# Patient Record
Sex: Female | Born: 1975 | Hispanic: Yes | Marital: Married | Smoking: Current some day smoker
Health system: Southern US, Community
[De-identification: ages and names within clinical notes are randomized; demographics above are authoritative.]

## PROBLEM LIST (undated history)

## (undated) DIAGNOSIS — D496 Neoplasm of unspecified behavior of brain: Secondary | ICD-10-CM

## (undated) DIAGNOSIS — A6000 Herpesviral infection of urogenital system, unspecified: Secondary | ICD-10-CM

## (undated) DIAGNOSIS — M542 Cervicalgia: Secondary | ICD-10-CM

## (undated) DIAGNOSIS — J45909 Unspecified asthma, uncomplicated: Secondary | ICD-10-CM

## (undated) HISTORY — PX: ABDOMINAL HYSTERECTOMY: SHX81

## (undated) HISTORY — DX: Unspecified asthma, uncomplicated: J45.909

## (undated) HISTORY — DX: Herpesviral infection of urogenital system, unspecified: A60.00

## (undated) HISTORY — DX: Neoplasm of unspecified behavior of brain: D49.6

## (undated) HISTORY — DX: Cervicalgia: M54.2

---

## 2009-10-19 ENCOUNTER — Ambulatory Visit: Payer: Self-pay | Admitting: Family Medicine

## 2009-10-20 ENCOUNTER — Ambulatory Visit: Payer: Self-pay | Admitting: Internal Medicine

## 2009-10-23 ENCOUNTER — Ambulatory Visit: Payer: Self-pay | Admitting: Internal Medicine

## 2010-08-11 ENCOUNTER — Ambulatory Visit: Payer: Self-pay | Admitting: Internal Medicine

## 2012-03-07 ENCOUNTER — Ambulatory Visit: Payer: Self-pay | Admitting: Internal Medicine

## 2012-11-20 ENCOUNTER — Ambulatory Visit: Payer: Self-pay | Admitting: Internal Medicine

## 2012-11-20 LAB — CBC WITH DIFFERENTIAL/PLATELET
Eosinophil %: 1.1 %
HCT: 36.9 % (ref 35.0–47.0)
HGB: 12.1 g/dL (ref 12.0–16.0)
Lymphocyte %: 16.2 %
MCHC: 32.8 g/dL (ref 32.0–36.0)
MCV: 87 fL (ref 80–100)
Monocyte #: 1.1 x10 3/mm — ABNORMAL HIGH (ref 0.2–0.9)
Monocyte %: 7.8 %
Neutrophil #: 10.7 10*3/uL — ABNORMAL HIGH (ref 1.4–6.5)
Neutrophil %: 74.5 %
RBC: 4.23 10*6/uL (ref 3.80–5.20)
RDW: 13.1 % (ref 11.5–14.5)
WBC: 14.3 10*3/uL — ABNORMAL HIGH (ref 3.6–11.0)

## 2012-11-20 LAB — COMPREHENSIVE METABOLIC PANEL
Alkaline Phosphatase: 83 U/L (ref 50–136)
Anion Gap: 9 (ref 7–16)
BUN: 6 mg/dL — ABNORMAL LOW (ref 7–18)
Bilirubin,Total: 0.3 mg/dL (ref 0.2–1.0)
Calcium, Total: 9.1 mg/dL (ref 8.5–10.1)
Chloride: 101 mmol/L (ref 98–107)
Co2: 26 mmol/L (ref 21–32)
Creatinine: 0.55 mg/dL — ABNORMAL LOW (ref 0.60–1.30)
EGFR (African American): >60
Osmolality: 268 (ref 275–301)
Potassium: 3.9 mmol/L (ref 3.5–5.1)
SGOT(AST): 12 U/L — ABNORMAL LOW (ref 15–37)
Sodium: 136 mmol/L (ref 136–145)
Total Protein: 7.3 g/dL (ref 6.4–8.2)

## 2012-11-20 LAB — URINALYSIS, COMPLETE
Glucose,UR: NEGATIVE mg/dL (ref 0–75)
Ketone: NEGATIVE
Ph: 6 (ref 4.5–8.0)
Protein: NEGATIVE
Specific Gravity: 1.01 (ref 1.003–1.030)

## 2012-11-20 LAB — AMYLASE: Amylase: 86 U/L (ref 25–115)

## 2012-11-20 LAB — LIPASE, BLOOD: Lipase: 131 U/L (ref 73–393)

## 2013-02-17 ENCOUNTER — Ambulatory Visit: Payer: Self-pay | Admitting: Obstetrics and Gynecology

## 2013-02-21 ENCOUNTER — Observation Stay: Payer: Self-pay | Admitting: Obstetrics and Gynecology

## 2013-02-21 LAB — URINALYSIS, COMPLETE
Bacteria: NONE SEEN
Glucose,UR: 150 mg/dL (ref 0–75)
Ketone: NEGATIVE
Protein: NEGATIVE
RBC,UR: 1 /HPF (ref 0–5)
Specific Gravity: 1.012 (ref 1.003–1.030)
Squamous Epithelial: 2
WBC UR: 2 /HPF (ref 0–5)

## 2013-03-05 ENCOUNTER — Ambulatory Visit: Payer: Self-pay | Admitting: Obstetrics and Gynecology

## 2013-03-05 LAB — CBC WITH DIFFERENTIAL/PLATELET
Basophil %: 0.1 %
Eosinophil #: 0 10*3/uL (ref 0.0–0.7)
Eosinophil %: 0.4 %
Lymphocyte #: 2.5 10*3/uL (ref 1.0–3.6)
Lymphocyte %: 18.8 %
MCHC: 34.7 g/dL (ref 32.0–36.0)
MCV: 87 fL (ref 80–100)
Monocyte #: 1.1 x10 3/mm — ABNORMAL HIGH (ref 0.2–0.9)
Monocyte %: 8 %
Neutrophil %: 72.7 %
RBC: 3.62 10*6/uL — ABNORMAL LOW (ref 3.80–5.20)
RDW: 14 % (ref 11.5–14.5)
WBC: 13.5 10*3/uL — ABNORMAL HIGH (ref 3.6–11.0)

## 2013-03-09 ENCOUNTER — Inpatient Hospital Stay: Payer: Self-pay | Admitting: Obstetrics and Gynecology

## 2013-03-10 LAB — HEMATOCRIT: HCT: 28.7 % — ABNORMAL LOW (ref 35.0–47.0)

## 2013-03-11 LAB — PATHOLOGY REPORT

## 2013-04-06 HISTORY — PX: CRANIOTOMY: SHX93

## 2014-08-29 ENCOUNTER — Emergency Department: Payer: Self-pay | Admitting: Emergency Medicine

## 2014-09-01 LAB — BETA STREP CULTURE(ARMC)

## 2014-11-26 NOTE — Consult Note (Signed)
   Maternal Age 39   Gravida 2   Para 1   Term Deliveries 1   Preterm Deliveries 0   Abortions 0   Living Children 1   Blood Type (Maternal) O positive   VDRL/RPR/Syphilis Results (Maternal) negative   Rubella Results (Maternal) immune   Hepatitis B Surface Antigen Results (Maternal) negative   Prenatal Care Adequate    Additional Comments Asked by Dr. Enzo Bi to provide prenatal consultation for this 19 y.o  G2 P1 mother who will have C/section on 8/4 at [redacted] wks EGA because she has a brain tumor for which she will undergo craniotomy 2 weeks later.  Pregnancy also complicated by diet-controlled gestational DM and recent ultrasound showed IUGR (infant measuring at 31 wk size, EFW 1560 gms).  Otherwise US shows normal fetal anatomy, fluid, and activity.  Mother has been given a course of BMZ.  Talked with patient and paternal GM about usual procedure at delivery including possible need for resuscitation.  Then presented expectations for preterm infants at 33 wks, including possible respiratory distress requiring O2 or respiratory support, hypothermia requiring incubator, feeding problems, and need for IV support.  Presented uncertain LOS, possibly 3 - 4 wks or until 36 - [redacted] wks EGA.  Discussed desirability of feeding mother's milk - she plans to pump and breast feed ASAP after delivery.  Patient and PGM were attentive and asked appropriate questions and expressed appreciation for my input.  Thank you for consulting Neonatology   Face-to-face time 20 minutes   Electronic Signatures: Bettey Costa (MD)  (Signed 31-Jul-14 17:35)  Authored: PREGNANCY and LABOR, ADDITIONAL COMMENTS   Last Updated: 31-Jul-14 17:35 by Bettey Costa (MD)

## 2014-11-26 NOTE — Op Note (Signed)
PATIENT NAME:  Lynn Chavez, Lynn Chavez MR#:  [id number] DATE OF BIRTH:  [birth year]  DATE OF PROCEDURE:  03/09/2013  PREOPERATIVE DIAGNOSES: 1.  32.0 week pregnancy, undelivered.  2.  Gestational diabetes, diet controlled.  3.  Sulcus brain tumor, maternal.   POSTOPERATIVE DIAGNOSES: 1.  32.0 week pregnancy, delivered.  2.  Gestational diabetes mellitus, diet controlled.  3.  Sulcus brain tumor, maternal.  4.  Viable female infant 3 pounds, 9 ounces.  5.  Desires sterilization.   OPERATIVE PROCEDURES: Primary low cervical transverse cesarean section with bilateral partial salpingectomy.   SURGEON: Lynn Chavez, M.D.  FIRST ASSISTANT: Lynn Chavez, certified nurse midwife.   ANESTHESIA: Spinal.   INDICATIONS: The patient is a 39 year old Hispanic female gravida 3, para 2-0-0-2, at 32.[redacted] weeks gestation, who presents for primary low cervical transverse cesarean section and bilateral partial salpingectomy for worsening symptomatic brain tumor in mom. The patient has developed vision loss; work-up has been notable for a sulcus brain tumor that is to require surgery within the next two weeks. The patient has received steroids for lung maturation prior to delivery.   FINDINGS AT SURGERY: Revealed a viable female infant 3 pounds, 9 ounces. Apgars were pending. The uterus, tubes, and ovaries were grossly normal.   DESCRIPTION OF PROCEDURE: The patient was brought to the operating room where she was placed in position. Spinal anesthetic was introduced without difficulty. There was placed in the supine position with right lateral hip roll in place. A ChloraPrep prep and drape was performed in the standard fashion. Foley catheter had been placed previously and was draining clear yellow urine. After checking for adequate level of anesthesia, a Pfannenstiel incision was made to the abdomen. The fascia was incised transversely and extended bilaterally. The midline raphe was incised, separated and the  peritoneum was entered. Bladder flap was created through sharp dissection. Low transverse incision was made in the uterus and this was extended bluntly bilaterally. The fetus was delivered through a vertex presentation and was noted to be vigorous at birth. Delayed Umbilical Cord clamping was done. The cord was triply clamped and cut. The infant was handed off to the awaiting resuscitation team. Cord blood sampling was obtained. Placenta was expressed from the uterus. The uterus was externalized onto the anterior abdominal wall and was cleared of all debris with laps. The incision was closed in two layers using #1 chromic suture. First layer was a running locking stitch, second layer was an imbricating layer. An additional figure-of-eight stitch was placed for hemostasis in the lower uterine segment. After noting normal adnexa bilaterally. The tubal ligation was performed in routine manner. The fallopian tubes were grasped with a Babcock clamp. A 0 plain suture was used to tie off midsegment portion of the tube. A second suture ligature was placed. The tube segment was excised. This was done bilaterally. Good hemostasis was noted. The tubes were sent to pathology. On completion of the procedure, the uterus was placed back into the abdominal pelvic cavity. Gutters were cleared of all debris with laps. The incision was closed in layers using 0 Maxon in a simple running manner on the fascia. The skin was closed with staples. Pressure dressing was applied. The patient was then mobilized and taken to the recovery room in satisfactory condition.   ESTIMATED BLOOD LOSS: 500 Ml.   COMPLICATIONS:  None.    All instruments, needle, and sponge counts were verified as correct.   The patient did receive Cleocin 900 mg IV antibiotic prophylaxis.  ____________________________ Lynn Slim Matty Deamer, MD mad:cc D: 03/09/2013 13:33:16 ET T: 03/09/2013 14:48:05 ET JOB#: [id number]  cc: Lynn Done A. Xana Bradt, MD,  <Dictator> [facility] MD ELECTRONICALLY SIGNED 03/10/2013 7:05

## 2014-12-14 NOTE — H&P (Signed)
L&D Evaluation:  History:  HPI 39yo MHF presents for scheduled primary c/s at 32 weeks secondary to succal brain tumor causing vision loss with planned surgery for removal in 2 weeks.   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 and partial loss of vision in rt eye   Patient's Medical History No Chronic Illness   Medications Pre Natal Vitamins  Tylenol (Acetaminophen)   Allergies NKDA   Social History none   Family History Non-Contributory   ROS:  General normal   HEENT HA, complete loss of vision of left eye   CNS normal   GI normal   GU normal   Resp normal   CV normal   Renal normal   MS normal   Exam:  Vital Signs stable   General no apparent distress   Mental Status clear   Chest clear   Heart normal sinus rhythm   Abdomen gravid, non-tender   Estimated Fetal Weight Average for gestational age   Pelvic cervix closed and thick   FHT normal rate with no decels   Ucx absent   Skin dry   Impression:  Impression 32 weeks for elective primary ceserean with bilateral tubal ligation   Plan:  Plan c/s with BTL   Electronic Signatures for Addendum Section:  DeFrancesco, Alanda Slim (MD) (Signed Addendum 04-Aug-14 12:38)  Pre op Counseling:  Pt is to have Primary Low Transverse Cesarean Section and BPS for symptomatic Sulcal Brain tumor causing vision loss. Pt has been counseled regarding indications for surgery and she is accepting of all risks, including but not limited to bleeding/infection/pelvic organ injury with need for repair/fetal injury/blood clot disorders/anesthesiua risks/BTL failure of 1 out of 300. All questions are answered. Informed consent is given.   Electronic Signatures: Evonnie Pat (CNM)  (Signed 04-Aug-14 11:54)  Authored: L&D Evaluation   Last Updated: 04-Aug-14 12:38 by DeFrancesco, Alanda Slim (MD)

## 2015-01-12 ENCOUNTER — Telehealth: Payer: Self-pay | Admitting: Obstetrics and Gynecology

## 2015-01-12 NOTE — Telephone Encounter (Signed)
PT AE IS MOVED TO 7/28 WILL YOU REFILL HER ACYLCOVIR UNTIL THAT DATE

## 2015-01-17 MED ORDER — ACYCLOVIR 400 MG PO TABS
400.0000 mg | ORAL_TABLET | Freq: Two times a day (BID) | ORAL | Status: DC
Start: 2015-01-17 — End: 2020-05-10

## 2015-01-17 MED ORDER — ACYCLOVIR 400 MG PO TABS: 400.0000 mg | ORAL_TABLET | Freq: Two times a day (BID) | ORAL | Status: DC

## 2015-01-17 NOTE — Telephone Encounter (Signed)
Refilled patients medication pt was notified

## 2015-03-01 ENCOUNTER — Encounter: Payer: Self-pay | Admitting: *Deleted

## 2015-03-03 ENCOUNTER — Encounter: Payer: Self-pay | Admitting: Obstetrics and Gynecology

## 2015-11-23 ENCOUNTER — Encounter: Payer: Self-pay | Admitting: *Deleted

## 2015-11-23 ENCOUNTER — Ambulatory Visit
Admission: EM | Admit: 2015-11-23 | Discharge: 2015-11-23 | Disposition: A | Discharge status: Home / Self Care | Payer: Self-pay | Attending: Family Medicine | Admitting: Family Medicine

## 2015-11-23 DIAGNOSIS — B349 Viral infection, unspecified: Secondary | ICD-10-CM

## 2015-11-23 LAB — RAPID INFLUENZA A&B ANTIGENS (ARMC ONLY)
INFLUENZA A (ARMC): NEGATIVE
INFLUENZA B (ARMC): NEGATIVE

## 2015-11-23 MED ORDER — HYDROCOD POLST-CPM POLST ER 10-8 MG/5ML PO SUER: 5.0000 mL | Freq: Every evening | ORAL | Status: DC | PRN

## 2015-11-23 MED ORDER — OSELTAMIVIR PHOSPHATE 75 MG PO CAPS: 75.0000 mg | ORAL_CAPSULE | Freq: Two times a day (BID) | ORAL | Status: DC

## 2015-11-23 NOTE — ED Notes (Signed)
Non-productive cough, fever, chest/abd soreness/pain with coughing, headache, since Sunday.

## 2015-11-23 NOTE — ED Provider Notes (Signed)
CSN: [id number]     Arrival date & time 11/23/15  1344 History   First MD Initiated Contact with Patient 11/23/15 1529     Chief Complaint  Patient presents with  . Cough  . Pleurisy  . Headache   (Consider location/radiation/quality/duration/timing/severity/associated sxs/prior Treatment) HPI: Patient presents today with nonproductive cough, subjective fever, chest soreness with coughing, headache, myalgias since Sunday. She denies any shortness of breath. She admits to recently quitting smoking. She denies any history of COPD. She has been told that she has had bronchitis in the past. She has had some nasal drainage and has been using allergy medication for this.  Past Medical History  Diagnosis Date  . Asthma   . Cervicalgia   . Brain tumor ([place])   . Genital herpes    Past Surgical History  Procedure Laterality Date  . Craniotomy  04/2013   Family History  Problem Relation Age of Onset  . Diabetes Father   . Diabetes Paternal Grandmother    Social History  Substance Use Topics  . Smoking status: Former Research scientist (life sciences)  . Smokeless tobacco: None  . Alcohol Use: Yes   OB History    Gravida Para Term Preterm AB TAB SAB Ectopic Multiple Living   1         1     Review of Systems: Negative except mentioned above.  Allergies  Penicillins  Home Medications   Prior to Admission medications   Medication Sig Start Date End Date Taking? Authorizing Provider  acyclovir (ZOVIRAX) 400 MG tablet Take 1 tablet (400 mg total) by mouth 2 (two) times daily. 01/17/15   Melody Rockney Ghee, CNM   Meds Ordered and Administered this Visit  Medications - No data to display  BP 118/63 mmHg  Pulse 79  Temp(Src) 99.2 F (37.3 C) (Oral)  Resp 16  Ht 5\' 3"  (1.6 m)  Wt 172 lb (78.019 kg)  BMI 30.48 kg/m2  SpO2 98%  LMP 11/04/2015 (Exact Date) No data found.   Physical Exam   GENERAL: NAD HEENT: mild pharyngeal erythema, no exudate, no erythema of TMs, no cervical LAD RESP: CTA B CARD:  RRR   ED Course  Procedures (including critical care time)  Labs Review Labs Reviewed  RAPID INFLUENZA A&B ANTIGENS ([facility] ONLY)    Imaging Review No results found.     MDM   A/P: Viral respiratory infection-will treat patient with Tamiflu, Tylenol/Motrin when necessary, oral antihistamine or nasal steroid prn, Tussionex when necessary at bedtime, she will try using Delsym during the daytime, rest, hydration, seek medical attention if symptoms persist or worsen as discussed.   Paulina Fusi, MD 11/23/15 [phone]

## 2017-08-17 ENCOUNTER — Encounter: Payer: Self-pay | Admitting: Emergency Medicine

## 2017-08-17 ENCOUNTER — Ambulatory Visit
Admission: EM | Admit: 2017-08-17 | Discharge: 2017-08-17 | Disposition: A | Discharge status: Home / Self Care | Payer: Self-pay | Attending: Family Medicine | Admitting: Family Medicine

## 2017-08-17 ENCOUNTER — Other Ambulatory Visit: Payer: Self-pay

## 2017-08-17 DIAGNOSIS — R05 Cough: Secondary | ICD-10-CM

## 2017-08-17 DIAGNOSIS — J069 Acute upper respiratory infection, unspecified: Secondary | ICD-10-CM

## 2017-08-17 DIAGNOSIS — B349 Viral infection, unspecified: Secondary | ICD-10-CM

## 2017-08-17 MED ORDER — IBUPROFEN 800 MG PO TABS: 800.0000 mg | ORAL_TABLET | Freq: Three times a day (TID) | ORAL | 0 refills | Status: DC

## 2017-08-17 MED ORDER — PROMETHAZINE-DM 6.25-15 MG/5ML PO SYRP: 5.0000 mL | ORAL_SOLUTION | Freq: Four times a day (QID) | ORAL | 0 refills | Status: DC | PRN

## 2017-08-17 NOTE — Discharge Instructions (Signed)
Please make sure you are drinking lots of fluids.  Take medication as prescribed for cough and nausea.  Continue with Tylenol Cold and flu.  You may use ibuprofen as needed during the day for body aches.  Return to the urgent care facility for any worsening symptoms or urgent changes in health.

## 2017-08-17 NOTE — ED Triage Notes (Signed)
Patient c/o cough, congestion, bodyaches and fever since Thursday.

## 2017-08-17 NOTE — ED Provider Notes (Signed)
[facility]    CSN: [id number] Arrival date & time: 08/17/17  1513     History   Chief Complaint Chief Complaint  Patient presents with  . Cough  . Generalized Body Aches  . Fever    HPI Lynn Chavez is a 42 y.o. female presents to the urgent care facility for evaluation of cough, vomiting x2, body aches and fever.  Symptoms been present for 3 days.  She has been taking Tylenol cold and flu as well as Delsym.  Fevers have been subjective.  She denies any chest pain, shortness of breath.  Cough is nonproductive.  She denies any sore throat or difficulty swallowing.  HPI  Past Medical History:  Diagnosis Date  . Asthma   . Brain tumor ([place])   . Cervicalgia   . Genital herpes     There are no active problems to display for this patient.   Past Surgical History:  Procedure Laterality Date  . CRANIOTOMY  04/2013    OB History    Gravida Para Term Preterm AB Living   1         1   SAB TAB Ectopic Multiple Live Births           1       Home Medications    Prior to Admission medications   Medication Sig Start Date End Date Taking? Authorizing Provider  acyclovir (ZOVIRAX) 400 MG tablet Take 1 tablet (400 mg total) by mouth 2 (two) times daily. 01/17/15   Shambley, Melody N, CNM  chlorpheniramine-HYDROcodone (TUSSIONEX PENNKINETIC ER) 10-8 MG/5ML SUER Take 5 mLs by mouth at bedtime as needed for cough. Can cause drowsiness. 11/23/15   Paulina Fusi, MD  ibuprofen (ADVIL,MOTRIN) 800 MG tablet Take 1 tablet (800 mg total) by mouth 3 (three) times daily. 08/17/17   Duanne Guess, PA-C  oseltamivir (TAMIFLU) 75 MG capsule Take 1 capsule (75 mg total) by mouth every 12 (twelve) hours. 11/23/15   Paulina Fusi, MD  promethazine-dextromethorphan (PROMETHAZINE-DM) 6.25-15 MG/5ML syrup Take 5 mLs by mouth 4 (four) times daily as needed for cough. 08/17/17   Duanne Guess, PA-C    Family History Family History  Problem Relation Age of Onset  .  Diabetes Father   . Diabetes Paternal Grandmother     Social History Social History   Tobacco Use  . Smoking status: Former Research scientist (life sciences)  . Smokeless tobacco: Never Used  Substance Use Topics  . Alcohol use: Yes  . Drug use: No     Allergies   Penicillins   Review of Systems Review of Systems  Constitutional: Positive for chills and fever. Negative for activity change and fatigue.  HENT: Positive for congestion. Negative for sinus pressure and sore throat.   Eyes: Negative for visual disturbance.  Respiratory: Positive for cough. Negative for chest tightness and shortness of breath.   Cardiovascular: Negative for chest pain and leg swelling.  Gastrointestinal: Positive for vomiting. Negative for abdominal pain, diarrhea and nausea.  Genitourinary: Negative for dysuria and pelvic pain.  Musculoskeletal: Negative for arthralgias and gait problem.  Skin: Negative for rash.  Neurological: Negative for weakness, numbness and headaches.  Hematological: Negative for adenopathy.  Psychiatric/Behavioral: Negative for agitation, behavioral problems and confusion.     Physical Exam Triage Vital Signs ED Triage Vitals  Enc Vitals Group     BP 08/17/17 1541 117/75     Pulse Rate 08/17/17 1541 65     Resp 08/17/17 1541 16  Temp 08/17/17 1541 98.5 F (36.9 C)     Temp Source 08/17/17 1541 Oral     SpO2 08/17/17 1541 100 %     Weight 08/17/17 1538 172 lb (78 kg)     Height 08/17/17 1538 5\' 3"  (1.6 m)     Head Circumference --      Peak Flow --      Pain Score 08/17/17 1538 7     Pain Loc --      Pain Edu? --      Excl. in [place]? --    No data found.  Updated Vital Signs BP 117/75 (BP Location: Left Arm)   Pulse 65   Temp 98.5 F (36.9 C) (Oral)   Resp 16   Ht 5\' 3"  (1.6 m)   Wt 172 lb (78 kg)   SpO2 100%   BMI 30.47 kg/m   Visual Acuity Right Eye Distance:   Left Eye Distance:   Bilateral Distance:    Right Eye Near:   Left Eye Near:    Bilateral Near:      Physical Exam  Constitutional: She is oriented to person, place, and time. She appears well-developed and well-nourished. No distress.  HENT:  Head: Normocephalic and atraumatic.  Right Ear: Hearing, tympanic membrane, external ear and ear canal normal.  Left Ear: Hearing, tympanic membrane, external ear and ear canal normal.  Nose: Rhinorrhea present.  Mouth/Throat: Mucous membranes are normal. No trismus in the jaw. No uvula swelling. Posterior oropharyngeal erythema present. No oropharyngeal exudate, posterior oropharyngeal edema or tonsillar abscesses. No tonsillar exudate.  Eyes: Conjunctivae are normal. Right eye exhibits no discharge. Left eye exhibits no discharge.  Neck: Normal range of motion.  Cardiovascular: Normal rate and regular rhythm.  Pulmonary/Chest: Effort normal and breath sounds normal. No stridor. No respiratory distress. She has no wheezes. She has no rales.  Abdominal: Soft. She exhibits no distension. There is no tenderness. There is no guarding.  Musculoskeletal: Normal range of motion. She exhibits no deformity.  Lymphadenopathy:    She has cervical adenopathy.  Neurological: She is alert and oriented to person, place, and time. She has normal reflexes.  Skin: Skin is warm and dry.  Psychiatric: She has a normal mood and affect. Her behavior is normal. Thought content normal.     UC Treatments / Results  Labs (all labs ordered are listed, but only abnormal results are displayed) Labs Reviewed - No data to display  EKG  EKG Interpretation None       Radiology No results found.  Procedures Procedures (including critical care time)  Medications Ordered in UC Medications - No data to display   Initial Impression / Assessment and Plan / UC Course  I have reviewed the triage vital signs and the nursing notes.  Pertinent labs & imaging results that were available during my care of the patient were reviewed by me and considered in my medical  decision making (see chart for details).     42 year old female with 3 days of cough.,  Chills, fever, body aches as well as his physician and was told nausea with 2 episodes of vomiting.  Patient tolerating p.o. well.  Will give prescription for dextromethorphan with Phenergan cough syrup.  She will continue with Tylenol cold.  She will increase fluids and she is educated on signs and symptoms return to the clinic for.  Final Clinical Impressions(s) / UC Diagnoses   Final diagnoses:  Viral upper respiratory tract infection  Viral syndrome  ED Discharge Orders        Ordered    promethazine-dextromethorphan (PROMETHAZINE-DM) 6.25-15 MG/5ML syrup  4 times daily PRN     08/17/17 1550    ibuprofen (ADVIL,MOTRIN) 800 MG tablet  3 times daily     08/17/17 1550         Duanne Guess, [place] 08/17/17 1605

## 2017-08-19 ENCOUNTER — Telehealth: Payer: Self-pay | Admitting: Emergency Medicine

## 2017-08-19 MED ORDER — ONDANSETRON 8 MG PO TBDP: 8.0000 mg | ORAL_TABLET | Freq: Three times a day (TID) | ORAL | 0 refills | Status: DC | PRN

## 2017-08-19 NOTE — Telephone Encounter (Signed)
Patient called in this morning stating that she was seen over the weekend and was given cough medication that has Phenergan in it, but she is still very nauseous. She is requesting something else for nausea. Please advise.

## 2017-08-19 NOTE — Telephone Encounter (Signed)
rx sent for zofran

## 2017-08-20 ENCOUNTER — Emergency Department: Payer: Self-pay

## 2017-08-20 ENCOUNTER — Emergency Department
Admission: EM | Admit: 2017-08-20 | Discharge: 2017-08-20 | Disposition: A | Discharge status: Home / Self Care | Payer: Self-pay | Attending: Emergency Medicine | Admitting: Emergency Medicine

## 2017-08-20 ENCOUNTER — Encounter: Payer: Self-pay | Admitting: Medical Oncology

## 2017-08-20 DIAGNOSIS — Z880 Allergy status to penicillin: Secondary | ICD-10-CM | POA: Insufficient documentation

## 2017-08-20 DIAGNOSIS — J45909 Unspecified asthma, uncomplicated: Secondary | ICD-10-CM | POA: Insufficient documentation

## 2017-08-20 DIAGNOSIS — Z87891 Personal history of nicotine dependence: Secondary | ICD-10-CM | POA: Insufficient documentation

## 2017-08-20 DIAGNOSIS — J189 Pneumonia, unspecified organism: Secondary | ICD-10-CM

## 2017-08-20 DIAGNOSIS — J181 Lobar pneumonia, unspecified organism: Secondary | ICD-10-CM | POA: Insufficient documentation

## 2017-08-20 LAB — INFLUENZA PANEL BY PCR (TYPE A & B)
INFLAPCR: NEGATIVE
INFLBPCR: NEGATIVE

## 2017-08-20 MED ORDER — ONDANSETRON HCL 4 MG PO TABS: 4.0000 mg | ORAL_TABLET | Freq: Every day | ORAL | 0 refills | Status: AC | PRN

## 2017-08-20 MED ORDER — LEVOFLOXACIN 750 MG PO TABS: 750.0000 mg | ORAL_TABLET | Freq: Every day | ORAL | 0 refills | Status: AC

## 2017-08-20 MED ORDER — IPRATROPIUM-ALBUTEROL 0.5-2.5 (3) MG/3ML IN SOLN
3.0000 mL | Freq: Once | RESPIRATORY_TRACT | Status: AC
  Administered 2017-08-20: 3 mL via RESPIRATORY_TRACT
  Filled 2017-08-20: qty 3

## 2017-08-20 MED ORDER — ALBUTEROL SULFATE (2.5 MG/3ML) 0.083% IN NEBU: 2.5000 mg | INHALATION_SOLUTION | Freq: Four times a day (QID) | RESPIRATORY_TRACT | 12 refills | Status: DC | PRN

## 2017-08-20 MED ORDER — ONDANSETRON 4 MG PO TBDP
4.0000 mg | ORAL_TABLET | Freq: Once | ORAL | Status: AC
  Administered 2017-08-20: 4 mg via ORAL
  Filled 2017-08-20: qty 1

## 2017-08-20 MED ORDER — ALBUTEROL SULFATE HFA 108 (90 BASE) MCG/ACT IN AERS: 2.0000 | INHALATION_SPRAY | Freq: Four times a day (QID) | RESPIRATORY_TRACT | 0 refills | Status: DC | PRN

## 2017-08-20 MED ORDER — LEVOFLOXACIN 750 MG PO TABS
750.0000 mg | ORAL_TABLET | Freq: Once | ORAL | Status: AC
  Administered 2017-08-20: 750 mg via ORAL
  Filled 2017-08-20: qty 1

## 2017-08-20 NOTE — ED Triage Notes (Signed)
Pt c/o cough, congestion, body aches and chills since Thursday.

## 2017-08-20 NOTE — ED Provider Notes (Signed)
[facility] Emergency Department Provider Note  ____________________________________________  Time seen: Approximately 11:52 AM  I have reviewed the triage vital signs and the nursing notes.   HISTORY  Chief Complaint Influenza    HPI Lynn Chavez is a 42 y.o. female that presents to the emergency department for evaluation of chills, nasal congestion, postnasal drip, productive cough with yellow sputum, vomiting for 5 days.  She is vomiting about once or twice per day.  She went to urgent care 3 days ago and was told that she has a virus but she is not getting any better.  She has been taking Tylenol Cold and flu without relief. No abdominal pain, diarrhea, constipation..    Past Medical History:  Diagnosis Date  . Asthma   . Brain tumor ([place])   . Cervicalgia   . Genital herpes     There are no active problems to display for this patient.   Past Surgical History:  Procedure Laterality Date  . CRANIOTOMY  04/2013    Prior to Admission medications   Medication Sig Start Date End Date Taking? Authorizing Provider  acyclovir (ZOVIRAX) 400 MG tablet Take 1 tablet (400 mg total) by mouth 2 (two) times daily. 01/17/15   Shambley, Melody N, CNM  albuterol (PROVENTIL HFA;VENTOLIN HFA) 108 (90 Base) MCG/ACT inhaler Inhale 2 puffs into the lungs every 6 (six) hours as needed for wheezing or shortness of breath. 08/20/17   Laban Emperor, PA-C  albuterol (PROVENTIL) (2.5 MG/3ML) 0.083% nebulizer solution Take 3 mLs (2.5 mg total) by nebulization every 6 (six) hours as needed for wheezing or shortness of breath. 08/20/17   Laban Emperor, PA-C  chlorpheniramine-HYDROcodone (TUSSIONEX PENNKINETIC ER) 10-8 MG/5ML SUER Take 5 mLs by mouth at bedtime as needed for cough. Can cause drowsiness. 11/23/15   Paulina Fusi, MD  ibuprofen (ADVIL,MOTRIN) 800 MG tablet Take 1 tablet (800 mg total) by mouth 3 (three) times daily. 08/17/17   Duanne Guess, PA-C   levofloxacin (LEVAQUIN) 750 MG tablet Take 1 tablet (750 mg total) by mouth daily for 7 days. 08/20/17 08/27/17  Laban Emperor, PA-C  ondansetron (ZOFRAN ODT) 8 MG disintegrating tablet Take 1 tablet (8 mg total) by mouth every 8 (eight) hours as needed. 08/19/17   Norval Gable, MD  ondansetron (ZOFRAN) 4 MG tablet Take 1 tablet (4 mg total) by mouth daily as needed for nausea or vomiting. 08/20/17 08/20/18  Laban Emperor, PA-C  oseltamivir (TAMIFLU) 75 MG capsule Take 1 capsule (75 mg total) by mouth every 12 (twelve) hours. 11/23/15   Paulina Fusi, MD  promethazine-dextromethorphan (PROMETHAZINE-DM) 6.25-15 MG/5ML syrup Take 5 mLs by mouth 4 (four) times daily as needed for cough. 08/17/17   Duanne Guess, PA-C    Allergies Penicillins  Family History  Problem Relation Age of Onset  . Diabetes Father   . Diabetes Paternal Grandmother     Social History Social History   Tobacco Use  . Smoking status: Former Research scientist (life sciences)  . Smokeless tobacco: Never Used  Substance Use Topics  . Alcohol use: Yes  . Drug use: No     Review of Systems  Constitutional: No fever/chills ENT: Positive for congestion and rhinorrhea. Cardiovascular: No chest pain. Respiratory: Positive for cough.  Gastrointestinal: No abdominal pain.  No diarrhea.  No constipation. Musculoskeletal: Negative for musculoskeletal pain. Skin: Negative for rash, abrasions, lacerations, ecchymosis.   ____________________________________________   PHYSICAL EXAM:  VITAL SIGNS: ED Triage Vitals [08/20/17 1116]  Enc Vitals Group  BP 113/70     Pulse Rate 83     Resp 18     Temp 98.1 F (36.7 C)     Temp Source Oral     SpO2 97 %     Weight 175 lb (79.4 kg)     Height 5\' 3"  (1.6 m)     Head Circumference      Peak Flow      Pain Score 8     Pain Loc      Pain Edu?      Excl. in [place]?      Constitutional: Alert and oriented. Well appearing and in no acute distress. Eyes: Conjunctivae are normal. PERRL.  EOMI. No discharge. Head: Atraumatic. ENT: No frontal and maxillary sinus tenderness.      Ears: Tympanic membranes pearly gray with good landmarks. No discharge.      Nose: Mild congestion/rhinnorhea.      Mouth/Throat: Mucous membranes are moist. Oropharynx non-erythematous. Tonsils not enlarged. No exudates. Uvula midline. Neck: No stridor.   Hematological/Lymphatic/Immunilogical: No cervical lymphadenopathy. Cardiovascular: Normal rate, regular rhythm.  Good peripheral circulation. Respiratory: Normal respiratory effort without tachypnea or retractions. Lungs CTAB. Good air entry to the bases with no decreased or absent breath sounds. Gastrointestinal: Bowel sounds 4 quadrants. Soft and nontender to palpation. No guarding or rigidity. No palpable masses. No distention. Musculoskeletal: Full range of motion to all extremities. No gross deformities appreciated. Neurologic:  Normal speech and language. No gross focal neurologic deficits are appreciated.  Skin:  Skin is warm, dry and intact. No rash noted. Psychiatric: Mood and affect are normal. Speech and behavior are normal. Patient exhibits appropriate insight and judgement.   ____________________________________________   LABS (all labs ordered are listed, but only abnormal results are displayed)  Labs Reviewed  INFLUENZA PANEL BY PCR (TYPE A & B)   ____________________________________________  EKG   ____________________________________________  RADIOLOGY Robinette Haines, personally viewed and evaluated these images (plain radiographs) as part of my medical decision making, as well as reviewing the written report by the radiologist.  Dg Chest 2 View  Result Date: 08/20/2017 CLINICAL DATA:  42 year old female with cough, congestion, body aches and chills EXAM: CHEST  2 VIEW COMPARISON:  Prior chest x-ray 08/29/2014 FINDINGS: Patchy right upper lobe airspace opacity in a peribronchovascular distribution concerning for  bronchopneumonia. The remainder of the lungs are clear. No pleural effusion or pneumothorax. The cardiac and mediastinal contours are normal. Osseous structures are intact and unremarkable. IMPRESSION: Right upper lobe pneumonia. Electronically Signed   By: Jacqulynn Cadet M.D.   On: 08/20/2017 13:05    ____________________________________________    PROCEDURES  Procedure(s) performed:    Procedures    Medications  ondansetron (ZOFRAN-ODT) disintegrating tablet 4 mg (4 mg Oral Given 08/20/17 1212)  ipratropium-albuterol (DUONEB) 0.5-2.5 (3) MG/3ML nebulizer solution 3 mL (3 mLs Nebulization Given 08/20/17 1212)  levofloxacin (LEVAQUIN) tablet 750 mg (750 mg Oral Given 08/20/17 1357)     ____________________________________________   INITIAL IMPRESSION / ASSESSMENT AND PLAN / ED COURSE  Pertinent labs & imaging results that were available during my care of the patient were reviewed by me and considered in my medical decision making (see chart for details).  Review of the [place] CSRS was performed in accordance of the [place] prior to dispensing any controlled drugs.    Patient's diagnosis is consistent with pneumonia. Vital signs and exam are reassuring. Chest xray consistent with right upper lobe pneumonia. Influenza negative. She does not want  any blood work completed. Patient appears well. She felt "great" after duoneb and zofran. She drank several glasses of water, juice and ate graham crackers in ED. She was given a dose of Levaquin in ED. She remembers having uncontrollable vomiting with penicillin and thinks she is also allergic to azithromycin. Reasources to quit smoking were provided. Patient feels comfortable going home. Patient will be discharged home with prescriptions for levoquin, zofran, albuerol inhaler and nebulizer. Patient is to follow up with PCP in 2 days or if symptoms are worsening. Patient is given ED precautions to return to the ED for any worsening or new  symptoms.     ____________________________________________  FINAL CLINICAL IMPRESSION(S) / ED DIAGNOSES  Final diagnoses:  Pneumonia of right upper lobe due to infectious organism [facility])      NEW MEDICATIONS STARTED DURING THIS VISIT:  ED Discharge Orders        Ordered    levofloxacin (LEVAQUIN) 750 MG tablet  Daily     08/20/17 1340    ondansetron (ZOFRAN) 4 MG tablet  Daily PRN     08/20/17 1340    albuterol (PROVENTIL HFA;VENTOLIN HFA) 108 (90 Base) MCG/ACT inhaler  Every 6 hours PRN     08/20/17 1340    albuterol (PROVENTIL) (2.5 MG/3ML) 0.083% nebulizer solution  Every 6 hours PRN     08/20/17 1340          This chart was dictated using voice recognition software/Dragon. Despite best efforts to proofread, errors can occur which can change the meaning. Any change was purely unintentional.    Laban Emperor, PA-C 08/20/17 1857    Clearnce Hasten Randall An, MD 08/21/17 [phone]

## 2017-08-20 NOTE — ED Notes (Signed)
See triage note  Presents with body aches cough and congestion fro couple of days   Afebrile on arrival

## 2018-03-13 ENCOUNTER — Telehealth: Payer: Self-pay | Admitting: Obstetrics and Gynecology

## 2018-03-13 NOTE — Telephone Encounter (Signed)
The patient called and stated that she is  Needing to speak with a nurse if possible today. The patient did not disclose any other information. Please advise.

## 2019-01-05 ENCOUNTER — Other Ambulatory Visit: Payer: Self-pay

## 2019-01-05 ENCOUNTER — Emergency Department
Admission: EM | Admit: 2019-01-05 | Discharge: 2019-01-05 | Disposition: A | Discharge status: Home / Self Care | Payer: Self-pay | Attending: Emergency Medicine | Admitting: Emergency Medicine

## 2019-01-05 DIAGNOSIS — Z87891 Personal history of nicotine dependence: Secondary | ICD-10-CM | POA: Insufficient documentation

## 2019-01-05 DIAGNOSIS — Z20822 Contact with and (suspected) exposure to covid-19: Secondary | ICD-10-CM

## 2019-01-05 DIAGNOSIS — Z0279 Encounter for issue of other medical certificate: Secondary | ICD-10-CM | POA: Insufficient documentation

## 2019-01-05 DIAGNOSIS — Z20828 Contact with and (suspected) exposure to other viral communicable diseases: Secondary | ICD-10-CM | POA: Insufficient documentation

## 2019-01-05 DIAGNOSIS — R0981 Nasal congestion: Secondary | ICD-10-CM | POA: Insufficient documentation

## 2019-01-05 DIAGNOSIS — J45909 Unspecified asthma, uncomplicated: Secondary | ICD-10-CM | POA: Insufficient documentation

## 2019-01-05 DIAGNOSIS — J029 Acute pharyngitis, unspecified: Secondary | ICD-10-CM | POA: Insufficient documentation

## 2019-01-05 NOTE — ED Notes (Signed)
Pt exposed to positive Covid case (brother-in-law) 1 week ago and then again Saturday night  Pt reports "scratchy throat", nasal congestion, denies cough Here to be tested for Covid because she needs a MD note for work

## 2019-01-05 NOTE — ED Triage Notes (Signed)
Pt states she was exposed to someone who tested positive for covid. Pt denies having any sx.

## 2019-01-05 NOTE — ED Provider Notes (Signed)
[facility] Emergency Department Provider Note       Time seen: ----------------------------------------- 1:11 PM on 01/05/2019 -----------------------------------------   I have reviewed the triage vital signs and the nursing notes.  HISTORY   Chief Complaint Covid testing   HPI Lynn Chavez is a 43 y.o. female with a history of asthma, brain tumor, genital herpes who presents to the ED for recent COVID-19 exposure.  Patient has had mild respiratory symptoms but otherwise denies any other complaints.  She presents for further evaluation.  Past Medical History:  Diagnosis Date  . Asthma   . Brain tumor ([place])   . Cervicalgia   . Genital herpes     There are no active problems to display for this patient.   Past Surgical History:  Procedure Laterality Date  . CRANIOTOMY  04/2013    Allergies Penicillins  Social History Social History   Tobacco Use  . Smoking status: Former Research scientist (life sciences)  . Smokeless tobacco: Never Used  Substance Use Topics  . Alcohol use: Yes  . Drug use: No    Review of Systems Constitutional: Negative for fever. Cardiovascular: Negative for chest pain. Respiratory: Negative for shortness of breath. Gastrointestinal: Negative for abdominal pain, vomiting and diarrhea. Musculoskeletal: Negative for back pain. Skin: Negative for rash. Neurological: Negative for headaches, focal weakness or numbness.  All systems negative/normal/unremarkable except as stated in the HPI  ____________________________________________   PHYSICAL EXAM:  VITAL SIGNS: ED Triage Vitals  Enc Vitals Group     BP 01/05/19 1155 117/72     Pulse Rate 01/05/19 1155 60     Resp 01/05/19 1155 16     Temp 01/05/19 1156 98.6 F (37 C)     Temp Source 01/05/19 1155 Oral     SpO2 01/05/19 1155 98 %     Weight 01/05/19 1155 152 lb (68.9 kg)     Height 01/05/19 1155 5\' 3"  (1.6 m)     Head Circumference --      Peak Flow --      Pain  Score 01/05/19 1155 0     Pain Loc --      Pain Edu? --      Excl. in [place]? --    Constitutional: Alert and oriented. Well appearing and in no distress. Eyes: Conjunctivae are normal. Normal extraocular movements. ENT      Head: Normocephalic and atraumatic.      Nose: No congestion/rhinnorhea.      Mouth/Throat: Mucous membranes are moist.      Neck: No stridor. Cardiovascular: Normal rate, regular rhythm. No murmurs, rubs, or gallops. Respiratory: Normal respiratory effort without tachypnea nor retractions. Breath sounds are clear and equal bilaterally. No wheezes/rales/rhonchi. Gastrointestinal: Soft and nontender. Normal bowel sounds Musculoskeletal: Nontender with normal range of motion in extremities. No lower extremity tenderness nor edema. Neurologic:  Normal speech and language. No gross focal neurologic deficits are appreciated.  Skin:  Skin is warm, dry and intact. No rash noted. Psychiatric: Mood and affect are normal. Speech and behavior are normal.  ____________________________________________  ED COURSE:  As part of my medical decision making, I reviewed the following data within the [place] History obtained from family if available, nursing notes, old chart and ekg, as well as notes from prior ED visits. Patient presented for recent COVID-19 exposure, we will assess with labs and imaging as indicated at this time.   Procedures  Nickisha Adeliz Tonkinson was evaluated in Emergency Department on 01/05/2019 for  the symptoms described in the history of present illness. She was evaluated in the context of the global COVID-19 pandemic, which necessitated consideration that the patient might be at risk for infection with the SARS-CoV-2 virus that causes COVID-19. Institutional protocols and algorithms that pertain to the evaluation of patients at risk for COVID-19 are in a state of rapid change based on information released by regulatory bodies including the CDC  and federal and state organizations. These policies and algorithms were followed during the patient's care in the ED.  ____________________________________________   LABS (pertinent positives/negatives)  Labs Reviewed  SARS CORONAVIRUS 2 (HOSPITAL ORDER, [place] [facility])  ____________________________________________   DIFFERENTIAL DIAGNOSIS   COVID-19, viral illness, medical screening exam  FINAL ASSESSMENT AND PLAN  COVID-19 exposure   Plan: The patient had presented for recent COVID-19 exposure. Patient's labs are pending, we sent a send out lab and should know the results in 48 hours.  She is cleared for outpatient follow-up.   Laurence Aly, MD    Note: This note was generated in part or whole with voice recognition software. Voice recognition is usually quite accurate but there are transcription errors that can and very often do occur. I apologize for any typographical errors that were not detected and corrected.     Earleen Newport, MD 01/05/19 [phone]

## 2019-01-16 IMAGING — CR DG CHEST 2V
1 series · 2 of 2 positions shown · non-contrast
Comparison: Prior chest x-ray 08/29/2014

CLINICAL DATA: 41-year-old female with cough, congestion, body
aches and chills

EXAM:
CHEST  2 VIEW

[Series 1: dg chest 2 view · 0.14mm/px · 2 of 2 slices shown]
[im 1/2]
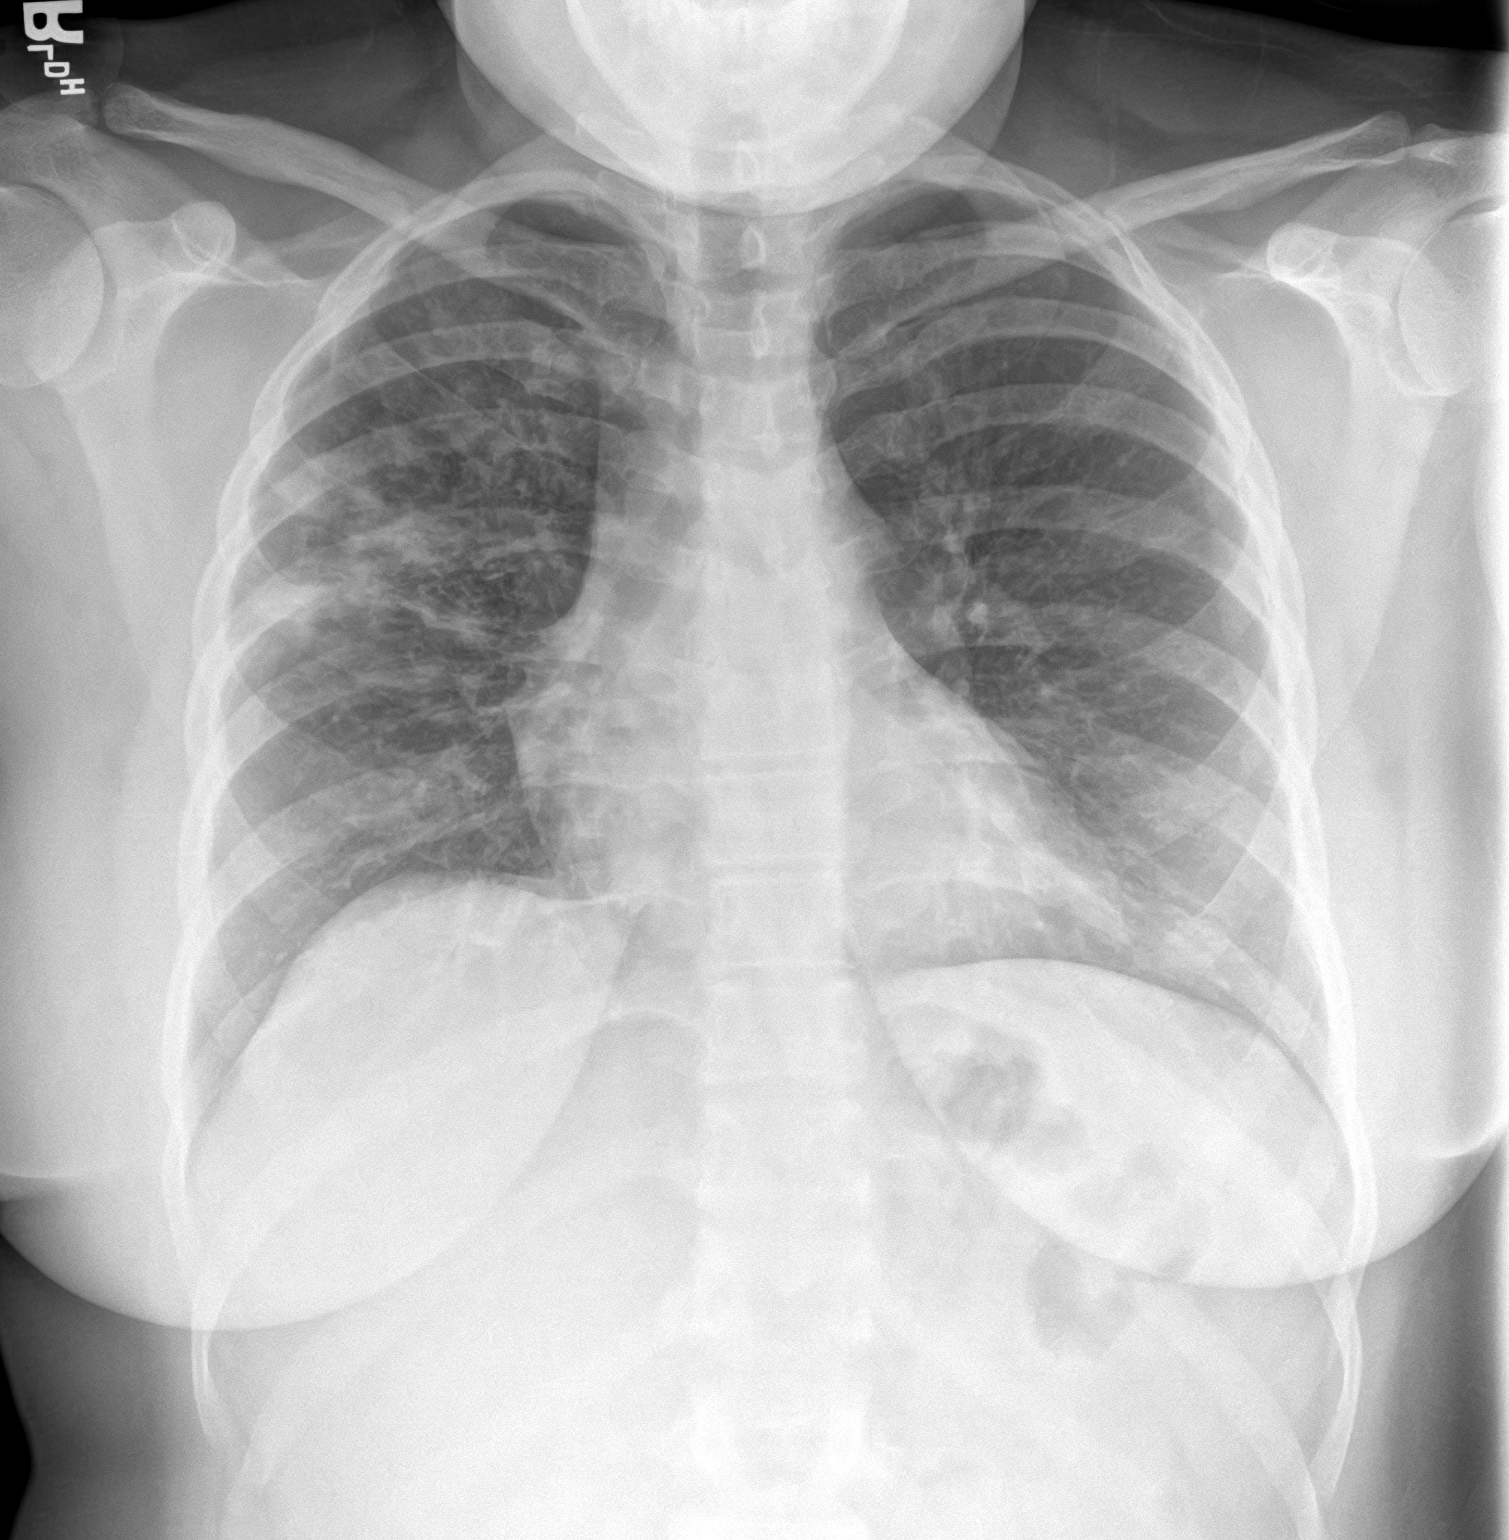
[im 2/2]
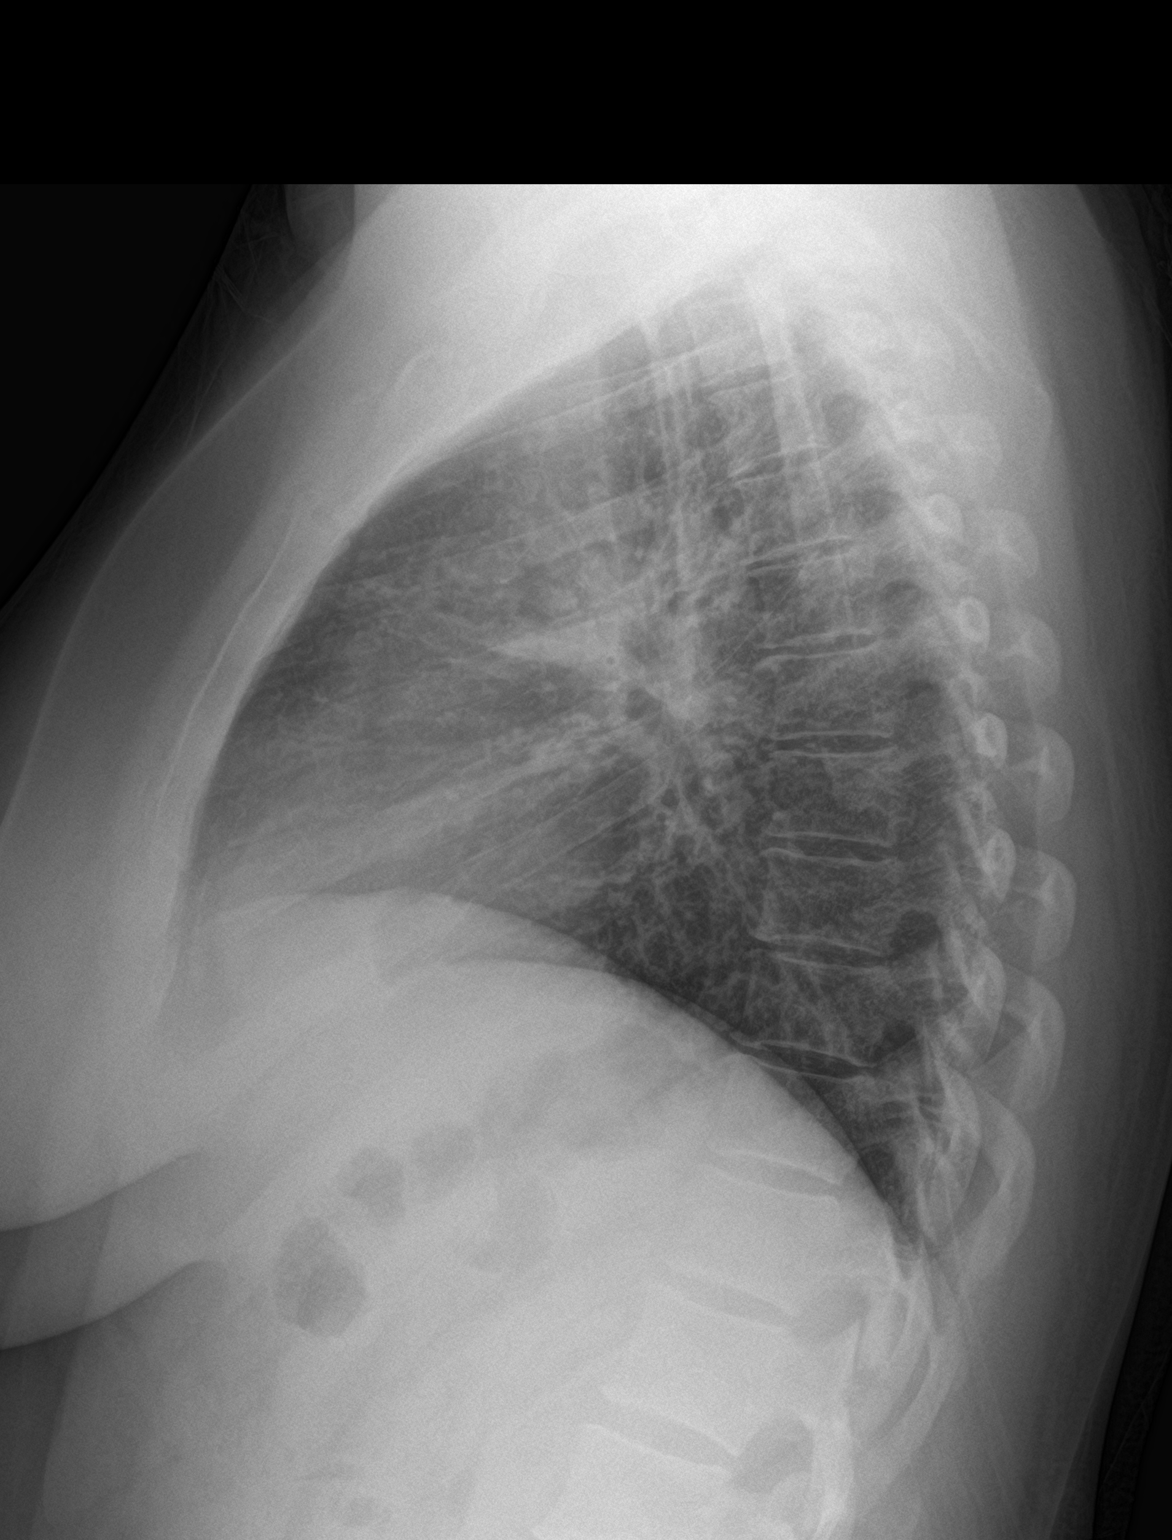

[2 of 2 positions shown; findings below may reference images not displayed]

FINDINGS: Patchy right upper lobe airspace opacity in a peribronchovascular
distribution concerning for bronchopneumonia. The remainder of the
lungs are clear. No pleural effusion or pneumothorax. The cardiac
and mediastinal contours are normal. Osseous structures are intact
and unremarkable.
IMPRESSION: Right upper lobe pneumonia.

## 2019-09-23 ENCOUNTER — Other Ambulatory Visit: Payer: Self-pay

## 2019-09-23 ENCOUNTER — Ambulatory Visit
Admission: EM | Admit: 2019-09-23 | Discharge: 2019-09-23 | Disposition: A | Discharge status: Home / Self Care | Payer: No Typology Code available for payment source | Attending: Family Medicine | Admitting: Family Medicine

## 2019-09-23 DIAGNOSIS — Z79899 Other long term (current) drug therapy: Secondary | ICD-10-CM | POA: Diagnosis not present

## 2019-09-23 DIAGNOSIS — Z791 Long term (current) use of non-steroidal anti-inflammatories (NSAID): Secondary | ICD-10-CM | POA: Diagnosis not present

## 2019-09-23 DIAGNOSIS — U071 COVID-19: Secondary | ICD-10-CM | POA: Insufficient documentation

## 2019-09-23 DIAGNOSIS — H6501 Acute serous otitis media, right ear: Secondary | ICD-10-CM

## 2019-09-23 DIAGNOSIS — J4520 Mild intermittent asthma, uncomplicated: Secondary | ICD-10-CM | POA: Diagnosis not present

## 2019-09-23 DIAGNOSIS — Z880 Allergy status to penicillin: Secondary | ICD-10-CM | POA: Diagnosis not present

## 2019-09-23 DIAGNOSIS — R0981 Nasal congestion: Secondary | ICD-10-CM | POA: Diagnosis present

## 2019-09-23 DIAGNOSIS — Z885 Allergy status to narcotic agent status: Secondary | ICD-10-CM | POA: Insufficient documentation

## 2019-09-23 DIAGNOSIS — Z87891 Personal history of nicotine dependence: Secondary | ICD-10-CM | POA: Diagnosis not present

## 2019-09-23 MED ORDER — ALBUTEROL SULFATE HFA 108 (90 BASE) MCG/ACT IN AERS: 2.0000 | INHALATION_SPRAY | Freq: Four times a day (QID) | RESPIRATORY_TRACT | 0 refills | Status: DC | PRN

## 2019-09-23 MED ORDER — SULFAMETHOXAZOLE-TRIMETHOPRIM 800-160 MG PO TABS: 1.0000 | ORAL_TABLET | Freq: Two times a day (BID) | ORAL | 0 refills | Status: DC

## 2019-09-23 MED ORDER — ALBUTEROL SULFATE (2.5 MG/3ML) 0.083% IN NEBU: 2.5000 mg | INHALATION_SOLUTION | Freq: Four times a day (QID) | RESPIRATORY_TRACT | 0 refills | Status: DC | PRN

## 2019-09-23 NOTE — ED Triage Notes (Signed)
Pt presents with c/o sinus congestion and pain, ear pressure, dizziness, sore throat for 2 days. Pt denies fever/chills, n/v/d or other symptoms. Pt has run out of her albuterol HFA, would like rx just in case. Pt denies any known recent sick contacts.

## 2019-09-25 LAB — NOVEL CORONAVIRUS, NAA (HOSP ORDER, SEND-OUT TO REF LAB; TAT 18-24 HRS): SARS-CoV-2, NAA: DETECTED — AB

## 2019-09-27 NOTE — ED Provider Notes (Signed)
[facility]    CSN: [id number] Arrival date & time: 09/23/19  1901      History   Chief Complaint Chief Complaint  Patient presents with  . Sinus Problem    HPI Lynn Chavez is a 44 y.o. female.   44 yo female with a c/o sinus congestion, ear pain and pressure, dizziness and sore throat for the past 2-3 days. States she has a h/o asthma and has run out of her albuterol. Denies any fevers, chills, chest pain, shortness of breath. No known sick contacts.    Sinus Problem    Past Medical History:  Diagnosis Date  . Asthma   . Brain tumor ([place])   . Cervicalgia   . Genital herpes     There are no problems to display for this patient.   Past Surgical History:  Procedure Laterality Date  . CRANIOTOMY  04/2013    OB History    Gravida  1   Para      Term      Preterm      AB      Living  1     SAB      TAB      Ectopic      Multiple      Live Births  1            Home Medications    Prior to Admission medications   Medication Sig Start Date End Date Taking? Authorizing Provider  acyclovir (ZOVIRAX) 400 MG tablet Take 1 tablet (400 mg total) by mouth 2 (two) times daily. 01/17/15   Shambley, Melody N, CNM  albuterol (PROVENTIL) (2.5 MG/3ML) 0.083% nebulizer solution Take 3 mLs (2.5 mg total) by nebulization every 6 (six) hours as needed for wheezing or shortness of breath. 09/23/19   Norval Gable, MD  albuterol (VENTOLIN HFA) 108 (90 Base) MCG/ACT inhaler Inhale 2 puffs into the lungs every 6 (six) hours as needed for wheezing or shortness of breath. 09/23/19   Norval Gable, MD  chlorpheniramine-HYDROcodone (TUSSIONEX PENNKINETIC ER) 10-8 MG/5ML SUER Take 5 mLs by mouth at bedtime as needed for cough. Can cause drowsiness. 11/23/15   Paulina Fusi, MD  ibuprofen (ADVIL,MOTRIN) 800 MG tablet Take 1 tablet (800 mg total) by mouth 3 (three) times daily. 08/17/17   Duanne Guess, PA-C  ondansetron (ZOFRAN ODT) 8 MG  disintegrating tablet Take 1 tablet (8 mg total) by mouth every 8 (eight) hours as needed. 08/19/17   Norval Gable, MD  oseltamivir (TAMIFLU) 75 MG capsule Take 1 capsule (75 mg total) by mouth every 12 (twelve) hours. 11/23/15   Paulina Fusi, MD  promethazine-dextromethorphan (PROMETHAZINE-DM) 6.25-15 MG/5ML syrup Take 5 mLs by mouth 4 (four) times daily as needed for cough. 08/17/17   Duanne Guess, PA-C  sulfamethoxazole-trimethoprim (BACTRIM DS) 800-160 MG tablet Take 1 tablet by mouth 2 (two) times daily. 09/23/19   Norval Gable, MD    Family History Family History  Problem Relation Age of Onset  . Diabetes Father   . Diabetes Paternal Grandmother     Social History Social History   Tobacco Use  . Smoking status: Former Research scientist (life sciences)  . Smokeless tobacco: Never Used  Substance Use Topics  . Alcohol use: Yes  . Drug use: No     Allergies   Adhesive  [tape], Morphine, and Penicillins   Review of Systems Review of Systems   Physical Exam Triage Vital Signs ED Triage Vitals  Enc Vitals Group  BP 09/23/19 1917 124/73     Pulse Rate 09/23/19 1917 67     Resp --      Temp 09/23/19 1917 99.4 F (37.4 C)     Temp Source 09/23/19 1917 Oral     SpO2 09/23/19 1917 100 %     Weight 09/23/19 1914 150 lb (68 kg)     Height 09/23/19 1914 5\' 3"  (1.6 m)     Head Circumference --      Peak Flow --      Pain Score 09/23/19 1914 0     Pain Loc --      Pain Edu? --      Excl. in [place]? --    No data found.  Updated Vital Signs BP 124/73 (BP Location: Left Arm)   Pulse 67   Temp 99.4 F (37.4 C) (Oral)   Ht 5\' 3"  (1.6 m)   Wt 68 kg   LMP 11/04/2015 (Exact Date)   SpO2 100%   BMI 26.57 kg/m   Visual Acuity Right Eye Distance:   Left Eye Distance:   Bilateral Distance:    Right Eye Near:   Left Eye Near:    Bilateral Near:     Physical Exam Vitals and nursing note reviewed.  Constitutional:      General: She is not in acute distress.    Appearance: She is not  toxic-appearing or diaphoretic.  HENT:     Right Ear: A middle ear effusion is present. Tympanic membrane is erythematous and bulging.     Nose: Congestion and rhinorrhea present.  Pulmonary:     Effort: Pulmonary effort is normal. No respiratory distress.     Breath sounds: No stridor. Wheezing (few, expiratory) present. No rhonchi or rales.  Neurological:     Mental Status: She is alert.      UC Treatments / Results  Labs (all labs ordered are listed, but only abnormal results are displayed) Labs Reviewed  NOVEL CORONAVIRUS, NAA (HOSP ORDER, SEND-OUT TO REF LAB; TAT 18-24 HRS) - Abnormal; Notable for the following components:      Result Value   SARS-CoV-2, NAA DETECTED (*)    All other components within normal limits    EKG   Radiology No results found.  Procedures Procedures (including critical care time)  Medications Ordered in UC Medications - No data to display  Initial Impression / Assessment and Plan / UC Course  I have reviewed the triage vital signs and the nursing notes.  Pertinent labs & imaging results that were available during my care of the patient were reviewed by me and considered in my medical decision making (see chart for details).      Final Clinical Impressions(s) / UC Diagnoses   Final diagnoses:  Right acute serous otitis media, recurrence not specified  Mild intermittent asthma without complication    ED Prescriptions    Medication Sig Dispense Auth. Provider   sulfamethoxazole-trimethoprim (BACTRIM DS) 800-160 MG tablet Take 1 tablet by mouth 2 (two) times daily. 20 tablet Norval Gable, MD   albuterol (VENTOLIN HFA) 108 (90 Base) MCG/ACT inhaler  (Status: Discontinued) Inhale 2 puffs into the lungs every 6 (six) hours as needed for wheezing or shortness of breath. 8 g Norval Gable, MD   albuterol (PROVENTIL) (2.5 MG/3ML) 0.083% nebulizer solution  (Status: Discontinued) Take 3 mLs (2.5 mg total) by nebulization every 6 (six) hours  as needed for wheezing or shortness of breath. 75 mL Norval Gable, MD  albuterol (PROVENTIL) (2.5 MG/3ML) 0.083% nebulizer solution Take 3 mLs (2.5 mg total) by nebulization every 6 (six) hours as needed for wheezing or shortness of breath. 75 mL Norval Gable, MD   albuterol (VENTOLIN HFA) 108 (90 Base) MCG/ACT inhaler Inhale 2 puffs into the lungs every 6 (six) hours as needed for wheezing or shortness of breath. 8 g Norval Gable, MD      1.diagnosis reviewed with patient 2. rx as per orders above; reviewed possible side effects, interactions, risks and benefits  3. Follow-up prn if symptoms worsen or don't improve   PDMP not reviewed this encounter.   Norval Gable, MD 09/27/19 [phone]

## 2019-09-28 ENCOUNTER — Telehealth (HOSPITAL_COMMUNITY): Payer: Self-pay | Admitting: Emergency Medicine

## 2019-09-28 MED ORDER — ONDANSETRON HCL 4 MG PO TABS: 4.0000 mg | ORAL_TABLET | Freq: Four times a day (QID) | ORAL | 0 refills | Status: DC

## 2019-09-28 NOTE — Telephone Encounter (Signed)
Your test for COVID-19 was positive, meaning that you were infected with the novel coronavirus and could give the germ to others.  Please continue isolation at home for at least 10 days since the start of your symptoms. If you do not have symptoms, please isolate at home for 10 days from the day you were tested. Once you complete your 10 day quarantine, you may return to normal activities as long as you've not had a fever for over 24 hours(without taking fever reducing medicine) and your symptoms are improving. Please continue good preventive care measures, including:  frequent hand-washing, avoid touching your face, cover coughs/sneezes, stay out of crowds and keep a 6 foot distance from others.  Go to the nearest hospital emergency room if fever/cough/breathlessness are severe or illness seems like a threat to life.  Able to reach family, stated she was aware of test results.

## 2019-09-28 NOTE — Telephone Encounter (Signed)
Pt returned call, requesting medicine for vomiting. Okay to send per Dr. Meda Coffee.

## 2019-10-09 ENCOUNTER — Telehealth: Payer: Self-pay | Admitting: Family Medicine

## 2019-10-09 NOTE — Telephone Encounter (Signed)
Note given to return to work.  [facility]

## 2020-05-10 ENCOUNTER — Ambulatory Visit
Admission: EM | Admit: 2020-05-10 | Discharge: 2020-05-10 | Disposition: A | Discharge status: Home / Self Care | Payer: No Typology Code available for payment source | Attending: Internal Medicine | Admitting: Internal Medicine

## 2020-05-10 ENCOUNTER — Other Ambulatory Visit: Payer: Self-pay

## 2020-05-10 DIAGNOSIS — H6502 Acute serous otitis media, left ear: Secondary | ICD-10-CM

## 2020-05-10 MED ORDER — CEFDINIR 300 MG PO CAPS: 300.0000 mg | ORAL_CAPSULE | Freq: Two times a day (BID) | ORAL | 0 refills | Status: AC

## 2020-05-10 NOTE — Discharge Instructions (Addendum)
Take the Cefdinir twice daily for 10 days.  Use OTC Flonase, 2 squirts in each nostril at bedtime for allergies and to help clear the fluid in your ear.  Take Allegra 180 mg daily for allergy symptoms.  Irrigate your sinuses 2x a day with warm distilled water and a Neilmed sinus rinse kit available at the pharmacy.

## 2020-05-10 NOTE — ED Triage Notes (Signed)
Patient complains of left ear pain x 2-3 days.

## 2020-05-10 NOTE — ED Provider Notes (Signed)
[facility]    CSN: [id number] Arrival date & time: 05/10/20  1237      History   Chief Complaint Chief Complaint  Patient presents with  . Otalgia    left    HPI Lynn Chavez is a 44 y.o. female.   44 yo female here for evaluation of left ear pain. She reports that the symptoms satrted 4 days ago. She has not had any changes in hearing, fever, runny nose, cough, ringing in her ears, dizziness or balance issues. She has had some slight nasal congestion.      Past Medical History:  Diagnosis Date  . Asthma   . Brain tumor ([place])   . Cervicalgia   . Genital herpes     There are no problems to display for this patient.   Past Surgical History:  Procedure Laterality Date  . ABDOMINAL HYSTERECTOMY    . CRANIOTOMY  04/2013    OB History    Gravida  1   Para      Term      Preterm      AB      Living  1     SAB      TAB      Ectopic      Multiple      Live Births  1            Home Medications    Prior to Admission medications   Medication Sig Start Date End Date Taking? Authorizing Provider  cefdinir (OMNICEF) 300 MG capsule Take 1 capsule (300 mg total) by mouth 2 (two) times daily for 10 days. 05/10/20 05/20/20  Margarette Canada, NP  albuterol (VENTOLIN HFA) 108 (90 Base) MCG/ACT inhaler Inhale 2 puffs into the lungs every 6 (six) hours as needed for wheezing or shortness of breath. 09/23/19 05/10/20  Norval Gable, MD    Family History Family History  Problem Relation Age of Onset  . Diabetes Father   . Diabetes Paternal Grandmother     Social History Social History   Tobacco Use  . Smoking status: Former Research scientist (life sciences)  . Smokeless tobacco: Never Used  Vaping Use  . Vaping Use: Never used  Substance Use Topics  . Alcohol use: Yes  . Drug use: No     Allergies   Adhesive  [tape], Morphine, and Penicillins   Review of Systems Review of Systems  Constitutional: Negative for activity change, appetite change  and fever.  HENT: Positive for congestion and ear pain. Negative for rhinorrhea, sore throat and tinnitus.   Respiratory: Negative for cough and shortness of breath.   Cardiovascular: Negative for chest pain.  Gastrointestinal: Negative for nausea and vomiting.  Genitourinary: Negative for dysuria and frequency.  Musculoskeletal: Negative for arthralgias and myalgias.  Skin: Negative.   Neurological: Negative for dizziness and syncope.  Hematological: Negative.   Psychiatric/Behavioral: Negative.      Physical Exam Triage Vital Signs ED Triage Vitals  Enc Vitals Group     BP 05/10/20 1321 118/85     Pulse Rate 05/10/20 1321 70     Resp 05/10/20 1321 18     Temp 05/10/20 1321 98.4 F (36.9 C)     Temp Source 05/10/20 1321 Oral     SpO2 05/10/20 1321 (!) 85 %     Weight 05/10/20 1318 148 lb (67.1 kg)     Height 05/10/20 1318 '5\' 3"'  (1.6 m)     Head Circumference --  Peak Flow --      Pain Score 05/10/20 1318 6     Pain Loc --      Pain Edu? --      Excl. in [place]? --    No data found.  Updated Vital Signs BP 118/85 (BP Location: Right Arm)   Pulse 70   Temp 98.4 F (36.9 C) (Oral)   Resp 18   Ht '5\' 3"'  (1.6 m)   Wt 148 lb (67.1 kg)   LMP 11/04/2015 (Exact Date)   SpO2 (!) 85%   BMI 26.22 kg/m   Visual Acuity Right Eye Distance:   Left Eye Distance:   Bilateral Distance:    Right Eye Near:   Left Eye Near:    Bilateral Near:     Physical Exam Vitals and nursing note reviewed.  Constitutional:      General: She is not in acute distress.    Appearance: Normal appearance.  HENT:     Head: Normocephalic and atraumatic.     Right Ear: Ear canal and external ear normal.     Left Ear: Ear canal and external ear normal.     Ears:     Comments: Both TM's are red with serous fluid present on the left.     Nose: Congestion present. No rhinorrhea.     Comments: Mild nasal mucosal edema without erythema or discharge.     Mouth/Throat:     Mouth: Mucous membranes  are moist.     Pharynx: Oropharynx is clear. No oropharyngeal exudate or posterior oropharyngeal erythema.  Eyes:     General: No scleral icterus.    Extraocular Movements: Extraocular movements intact.     Conjunctiva/sclera: Conjunctivae normal.     Pupils: Pupils are equal, round, and reactive to light.  Cardiovascular:     Rate and Rhythm: Normal rate and regular rhythm.     Pulses: Normal pulses.     Heart sounds: Normal heart sounds. No murmur heard.  No gallop.   Pulmonary:     Effort: Pulmonary effort is normal.     Breath sounds: Normal breath sounds. No wheezing, rhonchi or rales.  Musculoskeletal:        General: No tenderness. Normal range of motion.     Cervical back: Normal range of motion and neck supple.  Lymphadenopathy:     Cervical: Cervical adenopathy present.  Skin:    General: Skin is warm and dry.     Coloration: Skin is not jaundiced.     Findings: No rash.  Neurological:     General: No focal deficit present.     Mental Status: She is alert and oriented to person, place, and time.  Psychiatric:        Mood and Affect: Mood normal.        Behavior: Behavior normal.        Thought Content: Thought content normal.        Judgment: Judgment normal.      UC Treatments / Results  Labs (all labs ordered are listed, but only abnormal results are displayed) Labs Reviewed - No data to display  EKG   Radiology No results found.  Procedures Procedures (including critical care time)  Medications Ordered in UC Medications - No data to display  Initial Impression / Assessment and Plan / UC Course  I have reviewed the triage vital signs and the nursing notes.  Pertinent labs & imaging results that were available during my care of the patient  were reviewed by me and considered in my medical decision making (see chart for details).    Patient has had left ear fullness and nasal congestion for 4 days. She denies ringing in her rears or balance issues.  No HA or nausea. She has had itchiness of her left ear. Exam reveals bilateral erythematous TM's with a serous effusion on the left. Nasal mucosa is swollen but there is no discharge or erythema.   Serous otitis most likely allergic in nature. Will cover with antibiotics, and advise nasal irrigation, Flonase, and Allegra daily.   Final Clinical Impressions(s) / UC Diagnoses   Final diagnoses:  Non-recurrent acute serous otitis media of left ear     Discharge Instructions     Take the Cefdinir twice daily for 10 days.  Use OTC Flonase, 2 squirts in each nostril at bedtime for allergies and to help clear the fluid in your ear.  Take Allegra 180 mg daily for allergy symptoms.  Irrigate your sinuses 2x a day with warm distilled water and a Neilmed sinus rinse kit available at the pharmacy.     ED Prescriptions    Medication Sig Dispense Auth. Provider   cefdinir (OMNICEF) 300 MG capsule Take 1 capsule (300 mg total) by mouth 2 (two) times daily for 10 days. 20 capsule Margarette Canada, NP     PDMP not reviewed this encounter.   Margarette Canada, NP 05/10/20 1353

## 2020-05-11 ENCOUNTER — Encounter: Payer: Self-pay | Admitting: Intensive Care

## 2020-05-11 ENCOUNTER — Other Ambulatory Visit: Payer: Self-pay

## 2020-05-11 ENCOUNTER — Emergency Department: Payer: PRIVATE HEALTH INSURANCE

## 2020-05-11 ENCOUNTER — Emergency Department
Admission: EM | Admit: 2020-05-11 | Discharge: 2020-05-11 | Disposition: A | Discharge status: Home / Self Care | Payer: PRIVATE HEALTH INSURANCE | Attending: Emergency Medicine | Admitting: Emergency Medicine

## 2020-05-11 DIAGNOSIS — G510 Bell's palsy: Secondary | ICD-10-CM | POA: Diagnosis not present

## 2020-05-11 DIAGNOSIS — R9389 Abnormal findings on diagnostic imaging of other specified body structures: Secondary | ICD-10-CM

## 2020-05-11 DIAGNOSIS — J45909 Unspecified asthma, uncomplicated: Secondary | ICD-10-CM | POA: Insufficient documentation

## 2020-05-11 DIAGNOSIS — F17210 Nicotine dependence, cigarettes, uncomplicated: Secondary | ICD-10-CM | POA: Insufficient documentation

## 2020-05-11 LAB — COMPREHENSIVE METABOLIC PANEL
ALT: 21 U/L (ref 0–44)
AST: 15 U/L (ref 15–41)
Albumin: 4.6 g/dL (ref 3.5–5.0)
Alkaline Phosphatase: 76 U/L (ref 38–126)
Anion gap: 9 (ref 5–15)
BUN: 12 mg/dL (ref 6–20)
CO2: 26 mmol/L (ref 22–32)
Calcium: 9.3 mg/dL (ref 8.9–10.3)
Chloride: 106 mmol/L (ref 98–111)
Creatinine, Ser: 0.83 mg/dL (ref 0.44–1.00)
GFR calc non Af Amer: >60 mL/min (ref >60)
Glucose, Bld: 98 mg/dL (ref 70–99)
Potassium: 4.3 mmol/L (ref 3.5–5.1)
Sodium: 141 mmol/L (ref 135–145)
Total Bilirubin: 0.6 mg/dL (ref 0.3–1.2)
Total Protein: 7.7 g/dL (ref 6.5–8.1)

## 2020-05-11 LAB — CBC
HCT: 38.8 % (ref 36.0–46.0)
Hemoglobin: 13.1 g/dL (ref 12.0–15.0)
MCH: 29.6 pg (ref 26.0–34.0)
MCHC: 33.8 g/dL (ref 30.0–36.0)
MCV: 87.8 fL (ref 80.0–100.0)
Platelets: 306 10*3/uL (ref 150–400)
RBC: 4.42 MIL/uL (ref 3.87–5.11)
RDW: 13.2 % (ref 11.5–15.5)
WBC: 10.7 10*3/uL — ABNORMAL HIGH (ref 4.0–10.5)
nRBC: 0 % (ref 0.0–0.2)

## 2020-05-11 LAB — DIFFERENTIAL
Abs Immature Granulocytes: 0.05 10*3/uL (ref 0.00–0.07)
Basophils Absolute: 0.1 10*3/uL (ref 0.0–0.1)
Basophils Relative: 1 %
Eosinophils Absolute: 0.1 10*3/uL (ref 0.0–0.5)
Eosinophils Relative: 1 %
Immature Granulocytes: 1 %
Lymphocytes Relative: 29 %
Lymphs Abs: 3.1 10*3/uL (ref 0.7–4.0)
Monocytes Absolute: 0.6 10*3/uL (ref 0.1–1.0)
Monocytes Relative: 6 %
Neutro Abs: 6.7 10*3/uL (ref 1.7–7.7)
Neutrophils Relative %: 62 %

## 2020-05-11 LAB — APTT: aPTT: 28 seconds (ref 24–36)

## 2020-05-11 LAB — PROTIME-INR
INR: 1 (ref 0.8–1.2)
Prothrombin Time: 12.9 seconds (ref 11.4–15.2)

## 2020-05-11 MED ORDER — HYPROMELLOSE (GONIOSCOPIC) 2.5 % OP SOLN: 1.0000 [drp] | Freq: Four times a day (QID) | OPHTHALMIC | 0 refills | Status: DC

## 2020-05-11 MED ORDER — VALACYCLOVIR HCL 1 G PO TABS: 1000.0000 mg | ORAL_TABLET | Freq: Three times a day (TID) | ORAL | 0 refills | Status: DC

## 2020-05-11 MED ORDER — VALACYCLOVIR HCL 1 G PO TABS: 1000.0000 mg | ORAL_TABLET | Freq: Three times a day (TID) | ORAL | 0 refills | Status: AC

## 2020-05-11 MED ORDER — PREDNISONE 20 MG PO TABS: 60.0000 mg | ORAL_TABLET | Freq: Every day | ORAL | 0 refills | Status: DC

## 2020-05-11 MED ORDER — HYPROMELLOSE (GONIOSCOPIC) 2.5 % OP SOLN: 1.0000 [drp] | Freq: Four times a day (QID) | OPHTHALMIC | 0 refills | Status: AC

## 2020-05-11 MED ORDER — PREDNISONE 20 MG PO TABS: 60.0000 mg | ORAL_TABLET | Freq: Every day | ORAL | 0 refills | Status: AC

## 2020-05-11 NOTE — ED Provider Notes (Signed)
[facility] Emergency Department Provider Note  ____________________________________________   First MD Initiated Contact with Patient 05/11/20 1706     (approximate)  I have reviewed the triage vital signs and the nursing notes.   HISTORY  Chief Complaint Numbness and Tingling    HPI Lynn Chavez is a 44 y.o. female here with left facial paralysis.  The patient states her symptoms started several days ago with a tingling/pain in her left ear.  She states she felt an aural fullness with this as well.  She went to urgent care and was diagnosed with otitis media and was put on antibiotics.  She states that at that time, she had also noticed some weakness in her left face.  However, when she called her family today to face time, they noticed that her left face seemed completely paralyzed so she presents for evaluation.  She has had some intermittent "tingling" which she initially described as numbness, but sounds more like paresthesias along the left side of her face.  Denies any increased headaches.  No changes in her baseline poor vision due to her previous history of pituitary tumor.  She does note that she has residual tumor that she knows about, but has not necessarily followed up with neurosurgery.  She has not noticed any new neurological symptoms until the facial symptoms today.  No other complaints.  No fevers or chills.        Past Medical History:  Diagnosis Date  . Asthma   . Brain tumor ([place])   . Cervicalgia   . Genital herpes     There are no problems to display for this patient.   Past Surgical History:  Procedure Laterality Date  . ABDOMINAL HYSTERECTOMY    . CRANIOTOMY  04/2013    Prior to Admission medications   Medication Sig Start Date End Date Taking? Authorizing Provider  cefdinir (OMNICEF) 300 MG capsule Take 1 capsule (300 mg total) by mouth 2 (two) times daily for 10 days. 05/10/20 05/20/20  Margarette Canada, NP    hydroxypropyl methylcellulose / hypromellose (ISOPTO TEARS / GONIOVISC) 2.5 % ophthalmic solution Place 1 drop into the left eye 4 (four) times daily for 7 days. 05/11/20 05/18/20  Duffy Bruce, MD  predniSONE (DELTASONE) 20 MG tablet Take 3 tablets (60 mg total) by mouth daily for 7 days. 05/11/20 05/18/20  Duffy Bruce, MD  valACYclovir (VALTREX) 1000 MG tablet Take 1 tablet (1,000 mg total) by mouth 3 (three) times daily for 7 days. 05/11/20 05/18/20  Duffy Bruce, MD  albuterol (VENTOLIN HFA) 108 (90 Base) MCG/ACT inhaler Inhale 2 puffs into the lungs every 6 (six) hours as needed for wheezing or shortness of breath. 09/23/19 05/10/20  Norval Gable, MD    Allergies Adhesive  [tape], Morphine, and Penicillins  Family History  Problem Relation Age of Onset  . Diabetes Father   . Diabetes Paternal Grandmother     Social History Social History   Tobacco Use  . Smoking status: Current Every Day Smoker    Types: Cigarettes  . Smokeless tobacco: Never Used  Vaping Use  . Vaping Use: Never used  Substance Use Topics  . Alcohol use: Yes  . Drug use: No    Review of Systems  Review of Systems  Constitutional: Negative for fatigue and fever.  HENT: Negative for congestion and sore throat.   Eyes: Negative for visual disturbance.  Respiratory: Negative for cough and shortness of breath.   Cardiovascular: Negative for chest  pain.  Gastrointestinal: Negative for abdominal pain, diarrhea, nausea and vomiting.  Genitourinary: Negative for flank pain.  Musculoskeletal: Negative for back pain and neck pain.  Skin: Negative for rash and wound.  Neurological: Positive for facial asymmetry. Negative for weakness.  All other systems reviewed and are negative.    ____________________________________________  PHYSICAL EXAM:      VITAL SIGNS: ED Triage Vitals  Enc Vitals Group     BP 05/11/20 1212 121/83     Pulse Rate 05/11/20 1212 81     Resp 05/11/20 1212 16     Temp  05/11/20 1212 98.5 F (36.9 C)     Temp Source 05/11/20 1212 Oral     SpO2 05/11/20 1212 98 %     Weight 05/11/20 1206 148 lb (67.1 kg)     Height 05/11/20 1206 5\' 3"  (1.6 m)     Head Circumference --      Peak Flow --      Pain Score 05/11/20 1206 4     Pain Loc --      Pain Edu? --      Excl. in [place]? --      Physical Exam Vitals and nursing note reviewed.  Constitutional:      General: She is not in acute distress.    Appearance: She is well-developed.  HENT:     Head: Normocephalic and atraumatic.     Comments: Serous effusions bilaterally, mild erythema of the left tympanic membrane with bulging.  No mastoid tenderness or swelling. Eyes:     Conjunctiva/sclera: Conjunctivae normal.  Cardiovascular:     Rate and Rhythm: Normal rate and regular rhythm.     Heart sounds: Normal heart sounds.  Pulmonary:     Effort: Pulmonary effort is normal. No respiratory distress.     Breath sounds: No wheezing.  Abdominal:     General: There is no distension.  Musculoskeletal:     Cervical back: Neck supple.  Skin:    General: Skin is warm.     Capillary Refill: Capillary refill takes less than 2 seconds.     Findings: No rash.  Neurological:     Mental Status: She is alert and oriented to person, place, and time.     Motor: No abnormal muscle tone.      Neurological Exam:  Mental Status: Alert and oriented to person, place, and time. Attention and concentration normal. Speech clear. Recent memory is intact. Cranial Nerves: Visual fields grossly intact. PERRL but left eye laterally deviated, chronic. No nystagmus noted. Facial sensation intact at forehead, maxillary cheek, and chin/mandible bilaterally. Complete paralysis of left hemiface involving forehead, eyelid, cheek, chin. Hearing grossly normal. Uvula is midline, and palate elevates symmetrically. Normal SCM and trapezius strength. Tongue midline without fasciculations. Motor: Muscle strength 5/5 in proximal and distal UE  and LE bilaterally. No pronator drift. Muscle tone normal.  Sensation: Intact to light touch in upper and lower extremities distally bilaterally.  Gait: Normal without ataxia. Coordination: Normal FTN bilaterally.    ____________________________________________   LABS (all labs ordered are listed, but only abnormal results are displayed)  Labs Reviewed  CBC - Abnormal; Notable for the following components:      Result Value   WBC 10.7 (*)    All other components within normal limits  PROTIME-INR  APTT  DIFFERENTIAL  COMPREHENSIVE METABOLIC PANEL    ____________________________________________  EKG: Sinus bradycardia, ventricular rate 56.  PR 170, QRS 80, QTc 413.  No acute ST  elevations or depressions. ________________________________________  RADIOLOGY All imaging, including plain films, CT scans, and ultrasounds, independently reviewed by me, and interpretations confirmed via formal radiology reads.  ED MD interpretation:   CT head: Large hyperdense mass noted in the suprasellar area, which appears overtly unchanged compared to readings from prior, no acute abnormality  Official radiology report(s): CT HEAD WO CONTRAST  Result Date: 05/11/2020 CLINICAL DATA:  Neuro deficit, acute stroke suspected. Left-sided facial droop. EXAM: CT HEAD WITHOUT CONTRAST TECHNIQUE: Contiguous axial images were obtained from the base of the skull through the vertex without intravenous contrast. COMPARISON:  None. 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 This mass is hyperdense with areas of internal calcification. The mass likely exerts mass effect on the optic chiasm. Postsurgical changes of right pterional craniotomy. Extracranial and intracranial irregular hypodensity in this region most likely represents postsurgical material (possibly Gel-Foam). No  surrounding edema. No evidence of acute large vascular territory infarct. No acute hemorrhage. No hydrocephalus. Vascular: No hyperdense vessel identified. Skull: Right Terri on all craniotomy.  No acute fracture. Sinuses/Orbits: The visualized sinuses are clear. Other: No mastoid or middle ear effusions. IMPRESSION: 1. 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 A suprasellar mass was described on prior remote imaging in Care everywhere from 2014/2015. Recommend comparison with outside imaging and workup. An MRI with contrast could further evaluate. 2. No evidence of acute large vascular territory infarct. 3. Postsurgical changes of right pterional craniotomy. Extracranial and intracranial irregular hypodensity in this region most likely represents postsurgical material (possibly Gel-Foam) given no surrounding edema. Electronically Signed   By: Margaretha Sheffield MD   On: 05/11/2020 13:00    ____________________________________________  PROCEDURES   Procedure(s) performed (including Critical Care):  Procedures  ____________________________________________  INITIAL IMPRESSION / MDM / [place] / ED COURSE  As part of my medical decision making, I reviewed the following data within the [facility] notes reviewed and incorporated, Old chart reviewed, Notes from prior ED visits, and [place] Controlled Substance Database       *Lynn Chavez was evaluated in Emergency Department on 05/11/2020 for the symptoms described in the history of present illness. She was evaluated in the context of the global COVID-19 pandemic, which necessitated consideration that the patient might be at risk for infection with the SARS-CoV-2 virus that causes COVID-19. Institutional protocols and algorithms that pertain to the evaluation of patients at risk for COVID-19 are in a state of rapid change based  on information released by regulatory bodies including the CDC and federal and state organizations. These policies and algorithms were followed during the patient's care in the ED.  Some ED evaluations and interventions may be delayed as a result of limited staffing during the pandemic.*     Medical Decision Making: 44 year old female here with left peripheral Bell's palsy.  This occurs in the setting of recent otitis media.  Clinically, she does have evidence of likely resolving otitis and she has been on antibiotics.  No evidence of mastoiditis.  The primary concern is that she has a known suprasellar mass, which is difficult to compare to prior imaging given that this was obtained at outside hospital.  She states that her last reviewed on the mass showed it at 2.2 cm, which appears to be similar to the  size today.  There is no surrounding edema.  Her exam is more consistent with a peripheral palsy, though a local mass-effect just external to the brainstem could certainly produce this.  I did very long discussion with the patient regarding the differential including possibility of worsening of her underlying mass, and she would like to follow-up as an outpatient, which I feel is reasonable given absence of any edema or more emergent signs.  She will be on prednisone for her Bell's palsy which will likely help with this as well.  I referred her to neurosurgery here if she is unable to follow-up with [facility] within the next week.  ____________________________________________  FINAL CLINICAL IMPRESSION(S) / ED DIAGNOSES  Final diagnoses:  Left-sided Bell's palsy  Abnormal CT scan     MEDICATIONS GIVEN DURING THIS VISIT:  Medications - No data to display   ED Discharge Orders         Ordered    predniSONE (DELTASONE) 20 MG tablet  Daily        05/11/20 1811    valACYclovir (VALTREX) 1000 MG tablet  3 times daily        05/11/20 1811    hydroxypropyl methylcellulose / hypromellose (ISOPTO TEARS  / GONIOVISC) 2.5 % ophthalmic solution  4 times daily        05/11/20 1813           Note:  This document was prepared using Dragon voice recognition software and may include unintentional dictation errors.   Duffy Bruce, MD 05/11/20 Bosie Helper

## 2020-05-11 NOTE — ED Triage Notes (Addendum)
First RN Note: Pt presents to ED via POV, states is currently being treated for L ear infection with abx. Pt states has had total L sided facial paralysis x 2 days, pt with noted L sided facial droop, unable to lift L eye brow at this time. Pt also noted to not be able to fully close L eye at this time.    Pt states was seen at [facility] last night but left after triage due to wait times.

## 2020-05-11 NOTE — ED Triage Notes (Addendum)
Prescribed antibiotics yesterday for left ear infection. Then yesterday afternoon went to [facility] ER for left sided facial droop. Reports numbness/tingling on left side of face started Friday. Left after being triaged at [facility] ER because of long wait. Speech clear. No weakness. Able to ambulate with no problems. Legally blind in left eye.

## 2020-05-11 NOTE — Discharge Instructions (Addendum)
For your Bell's palsy, take the antibiotic as well as the prescribed steroids and Valtrex.  As we discussed, this can happen independently, but can also happen with your previous brain tumor.  It is difficult to compare the size of the current tumor to your previous scans.  It is critically important that you call your neurosurgeon or 1 of ours here, to set up follow-up within the next week for repeat imaging and follow-up.  Use artificial tears and to keep the eye shield on, particularly at night, until you regain the strength in your  face.

## 2020-09-28 ENCOUNTER — Other Ambulatory Visit: Payer: Self-pay

## 2020-09-28 ENCOUNTER — Ambulatory Visit
Admission: EM | Admit: 2020-09-28 | Discharge: 2020-09-28 | Disposition: A | Discharge status: Home / Self Care | Payer: No Typology Code available for payment source | Attending: Emergency Medicine | Admitting: Emergency Medicine

## 2020-09-28 ENCOUNTER — Encounter: Payer: Self-pay | Admitting: Emergency Medicine

## 2020-09-28 DIAGNOSIS — R519 Headache, unspecified: Secondary | ICD-10-CM | POA: Insufficient documentation

## 2020-09-28 DIAGNOSIS — R197 Diarrhea, unspecified: Secondary | ICD-10-CM | POA: Diagnosis present

## 2020-09-28 DIAGNOSIS — R112 Nausea with vomiting, unspecified: Secondary | ICD-10-CM | POA: Diagnosis not present

## 2020-09-28 DIAGNOSIS — Z20822 Contact with and (suspected) exposure to covid-19: Secondary | ICD-10-CM | POA: Diagnosis present

## 2020-09-28 LAB — SARS CORONAVIRUS 2 (TAT 6-24 HRS): SARS Coronavirus 2: NEGATIVE

## 2020-09-28 MED ORDER — ONDANSETRON 8 MG PO TBDP
8.0000 mg | ORAL_TABLET | Freq: Once | ORAL | Status: AC
  Administered 2020-09-28: 8 mg via ORAL

## 2020-09-28 MED ORDER — ONDANSETRON 4 MG PO TBDP: 4.0000 mg | ORAL_TABLET | Freq: Three times a day (TID) | ORAL | 0 refills | Status: DC | PRN

## 2020-09-28 MED ORDER — ACETAMINOPHEN 500 MG PO TABS
1000.0000 mg | ORAL_TABLET | Freq: Once | ORAL | Status: AC
  Administered 2020-09-28: 1000 mg via ORAL

## 2020-09-28 NOTE — ED Triage Notes (Signed)
Patient in today c/o headache, emesis and diarrhea x today. Patient has had several covid exposures last week. Patient requesting covid test today. Patient has not had the covid vaccines. Patient has not taken any OTC medications to help with symptoms.

## 2020-09-28 NOTE — ED Provider Notes (Signed)
[facility]    CSN: [id number] Arrival date & time: 09/28/20  1210      History   Chief Complaint Chief Complaint  Patient presents with  . Headache  . Emesis  . Diarrhea    HPI Lynn Chavez is a 45 y.o. female.   Lynn Chavez presents with complaints of nausea, vomiting and diarrhea which started this morning. No URI symptoms. Her brother tested positive for covid-19 last week, she had been around him. There has also been covid-19 at her workplace. No abdominal pain. Still with mild nausea. Headache. Dizzy this morning, this has improved. No vision changes. No blood or black to stool or emesis. Denies any previous similar.    ROS per HPI, negative if not otherwise mentioned.      Past Medical History:  Diagnosis Date  . Asthma   . Brain tumor ([place])   . Cervicalgia   . Genital herpes     There are no problems to display for this patient.   Past Surgical History:  Procedure Laterality Date  . ABDOMINAL HYSTERECTOMY    . CRANIOTOMY  04/2013    OB History    Gravida  1   Para      Term      Preterm      AB      Living  1     SAB      IAB      Ectopic      Multiple      Live Births  1            Home Medications    Prior to Admission medications   Medication Sig Start Date End Date Taking? Authorizing Provider  ondansetron (ZOFRAN-ODT) 4 MG disintegrating tablet Take 1 tablet (4 mg total) by mouth every 8 (eight) hours as needed for nausea or vomiting. 09/28/20  Yes Burky, Malachy Moan, NP  albuterol (VENTOLIN HFA) 108 (90 Base) MCG/ACT inhaler Inhale 2 puffs into the lungs every 6 (six) hours as needed for wheezing or shortness of breath. 09/23/19 05/10/20  Norval Gable, MD    Family History Family History  Problem Relation Age of Onset  . Diabetes Father   . Diabetes Paternal Grandmother   . Hypotension Mother   . Heart disease Mother     Social History Social History   Tobacco Use  .  Smoking status: Current Every Day Smoker    Packs/day: 0.25    Years: 5.00    Pack years: 1.25    Types: Cigarettes  . Smokeless tobacco: Never Used  Vaping Use  . Vaping Use: Never used  Substance Use Topics  . Alcohol use: Yes    Comment: rare  . Drug use: No     Allergies   Adhesive  [tape], Morphine, and Penicillins   Review of Systems Review of Systems   Physical Exam Triage Vital Signs ED Triage Vitals  Enc Vitals Group     BP 09/28/20 1225 119/70     Pulse Rate 09/28/20 1225 90     Resp 09/28/20 1225 18     Temp 09/28/20 1225 99.5 F (37.5 C)     Temp Source 09/28/20 1225 Oral     SpO2 09/28/20 1225 100 %     Weight 09/28/20 1225 160 lb (72.6 kg)     Height 09/28/20 1225 5\' 3"  (1.6 m)     Head Circumference --      Peak Flow --  Pain Score 09/28/20 1224 4     Pain Loc --      Pain Edu? --      Excl. in [place]? --    No data found.  Updated Vital Signs BP 119/70 (BP Location: Left Arm)   Pulse 90   Temp 99.5 F (37.5 C) (Oral)   Resp 18   Ht 5\' 3"  (1.6 m)   Wt 160 lb (72.6 kg)   LMP 11/04/2015 (Exact Date)   SpO2 100%   BMI 28.34 kg/m   Visual Acuity Right Eye Distance:   Left Eye Distance:   Bilateral Distance:    Right Eye Near:   Left Eye Near:    Bilateral Near:     Physical Exam Constitutional:      General: She is not in acute distress.    Appearance: She is well-developed.  Cardiovascular:     Rate and Rhythm: Normal rate.  Pulmonary:     Effort: Pulmonary effort is normal.  Abdominal:     Tenderness: There is no abdominal tenderness.  Skin:    General: Skin is warm and dry.  Neurological:     Mental Status: She is alert and oriented to person, place, and time.      UC Treatments / Results  Labs (all labs ordered are listed, but only abnormal results are displayed) Labs Reviewed  SARS CORONAVIRUS 2 (TAT 6-24 HRS)    EKG   Radiology No results found.  Procedures Procedures (including critical care  time)  Medications Ordered in UC Medications  acetaminophen (TYLENOL) tablet 1,000 mg (has no administration in time range)  ondansetron (ZOFRAN-ODT) disintegrating tablet 8 mg (has no administration in time range)    Initial Impression / Assessment and Plan / UC Course  I have reviewed the triage vital signs and the nursing notes.  Pertinent labs & imaging results that were available during my care of the patient were reviewed by me and considered in my medical decision making (see chart for details).     Non toxic. Benign physical exam.  History and physical consistent with viral illness.  Covid testing pending and isolation instructions provided.  Supportive cares recommended. Return precautions provided. Patient verbalized understanding and agreeable to plan.   Final Clinical Impressions(s) / UC Diagnoses   Final diagnoses:  Nausea vomiting and diarrhea  Bad headache  Exposure to COVID-19 virus     Discharge Instructions     Self isolate until covid results are back.  We will notify you by phone if it is positive. Your negative results will be sent through your MyChart.    If it is positive you need to isolate from others for a total of 5 days. If no fever for 24 hours without medications, and symptoms improving you may end isolation on day 6, but wear a mask if around any others for an additional 5 days.   Small frequent sips of fluids- Pedialyte, Gatorade, water, broth- to maintain hydration.  Zofran every 8 hours as needed for nausea or vomiting.     ED Prescriptions    Medication Sig Dispense Auth. Provider   ondansetron (ZOFRAN-ODT) 4 MG disintegrating tablet Take 1 tablet (4 mg total) by mouth every 8 (eight) hours as needed for nausea or vomiting. 12 tablet Zigmund Gottron, NP     PDMP not reviewed this encounter.   Zigmund Gottron, NP 09/28/20 1242

## 2020-09-28 NOTE — Discharge Instructions (Addendum)
Self isolate until covid results are back.  We will notify you by phone if it is positive. Your negative results will be sent through your MyChart.    If it is positive you need to isolate from others for a total of 5 days. If no fever for 24 hours without medications, and symptoms improving you may end isolation on day 6, but wear a mask if around any others for an additional 5 days.   Small frequent sips of fluids- Pedialyte, Gatorade, water, broth- to maintain hydration.  Zofran every 8 hours as needed for nausea or vomiting.

## 2020-12-13 ENCOUNTER — Ambulatory Visit
Admission: EM | Admit: 2020-12-13 | Discharge: 2020-12-13 | Disposition: A | Discharge status: Home / Self Care | Payer: No Typology Code available for payment source | Attending: Physician Assistant | Admitting: Physician Assistant

## 2020-12-13 ENCOUNTER — Other Ambulatory Visit: Payer: Self-pay

## 2020-12-13 DIAGNOSIS — J309 Allergic rhinitis, unspecified: Secondary | ICD-10-CM

## 2020-12-13 DIAGNOSIS — H9202 Otalgia, left ear: Secondary | ICD-10-CM | POA: Diagnosis not present

## 2020-12-13 DIAGNOSIS — R0981 Nasal congestion: Secondary | ICD-10-CM

## 2020-12-13 MED ORDER — IPRATROPIUM BROMIDE 0.06 % NA SOLN: 2.0000 | Freq: Four times a day (QID) | NASAL | 12 refills | Status: DC

## 2020-12-13 NOTE — ED Triage Notes (Signed)
Pt c/o left ear pain and facial pressure. States she also has some congestion. Started Friday

## 2020-12-13 NOTE — ED Provider Notes (Signed)
[facility]    CSN: [id number] Arrival date & time: 12/13/20  1305      History   Chief Complaint Chief Complaint  Patient presents with  . Ear Pain    HPI Lynn Chavez is a 45 y.o. female presenting for left ear pain and sinus pressure x3 to 4 days.  She also admits to nasal congestion.  Denies any fever, fatigue, body aches, cough, sore throat, chest pain, shortness of breath, abdominal pain, nausea/vomiting or diarrhea.  No sick contacts and no known exposure to COVID-19.  Patient's been using over-the-counter nasal sprays and has tried Mucinex and Sudafed without relief.  Patient concerned about possible ear infection because she has had 1 before and recently had radiation for her brain tumor.  Patient is not vaccinated for COVID-19.  Personal history of COVID-19 last summer.  Other medical history significant for asthma.  No other concerns today.  HPI  Past Medical History:  Diagnosis Date  . Asthma   . Brain tumor ([place])   . Cervicalgia   . Genital herpes     There are no problems to display for this patient.   Past Surgical History:  Procedure Laterality Date  . ABDOMINAL HYSTERECTOMY    . CRANIOTOMY  04/2013    OB History    Gravida  1   Para      Term      Preterm      AB      Living  1     SAB      IAB      Ectopic      Multiple      Live Births  1            Home Medications    Prior to Admission medications   Medication Sig Start Date End Date Taking? Authorizing Provider  ipratropium (ATROVENT) 0.06 % nasal spray Place 2 sprays into both nostrils 4 (four) times daily. 12/13/20  Yes Laurene Footman B, PA-C  ondansetron (ZOFRAN-ODT) 4 MG disintegrating tablet Take 1 tablet (4 mg total) by mouth every 8 (eight) hours as needed for nausea or vomiting. 09/28/20   Zigmund Gottron, NP  albuterol (VENTOLIN HFA) 108 (90 Base) MCG/ACT inhaler Inhale 2 puffs into the lungs every 6 (six) hours as needed for wheezing or  shortness of breath. 09/23/19 05/10/20  Norval Gable, MD    Family History Family History  Problem Relation Age of Onset  . Diabetes Father   . Diabetes Paternal Grandmother   . Hypotension Mother   . Heart disease Mother     Social History Social History   Tobacco Use  . Smoking status: Current Every Day Smoker    Packs/day: 0.25    Years: 5.00    Pack years: 1.25    Types: Cigarettes  . Smokeless tobacco: Never Used  Vaping Use  . Vaping Use: Never used  Substance Use Topics  . Alcohol use: Yes    Comment: rare  . Drug use: No     Allergies   Adhesive  [tape], Morphine, and Penicillins   Review of Systems Review of Systems  Constitutional: Negative for chills, diaphoresis, fatigue and fever.  HENT: Positive for congestion, ear pain, rhinorrhea and sinus pressure. Negative for sore throat.   Respiratory: Negative for cough and shortness of breath.   Gastrointestinal: Negative for abdominal pain, nausea and vomiting.  Musculoskeletal: Negative for arthralgias and myalgias.  Skin: Negative for rash.  Neurological: Positive  for headaches. Negative for weakness.  Hematological: Negative for adenopathy.     Physical Exam Triage Vital Signs ED Triage Vitals  Enc Vitals Group     BP 12/13/20 1357 (!) 143/88     Pulse Rate 12/13/20 1357 70     Resp 12/13/20 1357 16     Temp 12/13/20 1357 98.9 F (37.2 C)     Temp Source 12/13/20 1357 Oral     SpO2 12/13/20 1357 100 %     Weight 12/13/20 1356 164 lb (74.4 kg)     Height 12/13/20 1356 5\' 3"  (1.6 m)     Head Circumference --      Peak Flow --      Pain Score 12/13/20 1356 0     Pain Loc --      Pain Edu? --      Excl. in [place]? --    No data found.  Updated Vital Signs BP (!) 143/88 (BP Location: Left Arm)   Pulse 70   Temp 98.9 F (37.2 C) (Oral)   Resp 16   Ht 5\' 3"  (1.6 m)   Wt 164 lb (74.4 kg)   LMP 11/04/2015 (Exact Date)   SpO2 100%   BMI 29.05 kg/m       Physical Exam Vitals and nursing  note reviewed.  Constitutional:      General: She is not in acute distress.    Appearance: Normal appearance. She is not ill-appearing or toxic-appearing.  HENT:     Head: Normocephalic and atraumatic.     Right Ear: Hearing, ear canal and external ear normal. A middle ear effusion is present. Tympanic membrane is injected.     Left Ear: Hearing, ear canal and external ear normal. A middle ear effusion is present. Tympanic membrane is injected.     Nose: Mucosal edema, congestion and rhinorrhea present.     Mouth/Throat:     Mouth: Mucous membranes are moist.     Pharynx: Oropharynx is clear.  Eyes:     General: No scleral icterus.       Right eye: No discharge.        Left eye: No discharge.     Conjunctiva/sclera: Conjunctivae normal.  Cardiovascular:     Rate and Rhythm: Normal rate and regular rhythm.     Heart sounds: Normal heart sounds.  Pulmonary:     Effort: Pulmonary effort is normal. No respiratory distress.     Breath sounds: Normal breath sounds.  Musculoskeletal:     Cervical back: Neck supple.  Skin:    General: Skin is dry.  Neurological:     General: No focal deficit present.     Mental Status: She is alert. Mental status is at baseline.     Motor: No weakness.     Gait: Gait normal.  Psychiatric:        Mood and Affect: Mood normal.        Behavior: Behavior normal.        Thought Content: Thought content normal.      UC Treatments / Results  Labs (all labs ordered are listed, but only abnormal results are displayed) Labs Reviewed - No data to display  EKG   Radiology No results found.  Procedures Procedures (including critical care time)  Medications Ordered in UC Medications - No data to display  Initial Impression / Assessment and Plan / UC Course  I have reviewed the triage vital signs and the nursing notes.  Pertinent  labs & imaging results that were available during my care of the patient were reviewed by me and considered in my  medical decision making (see chart for details).   45 year old female presenting for 3 to 4-day history of left-sided ear pain, congestion and sinus pressure.  Vital signs are all stable.  She is afebrile and overall well-appearing.  Exam significant for effusions of bilateral TMs and nasal congestion.  Patient does have history of allergies.  Advised her she is likely having a flareup of her allergies versus a viral infection.  She declines a COVID test.  Advised OTC Allegra-D or Zyrtec-D and using nasal saline rinses.  Also sent Atrovent nasal spray.  Encouraged her to follow-up for any worsening symptoms or if she is not better in 7 to 10 days.  Final Clinical Impressions(s) / UC Diagnoses   Final diagnoses:  Left ear pain  Nasal congestion  Allergic rhinitis, unspecified seasonality, unspecified trigger     Discharge Instructions     Your ear does not appear to be infected at this time.  There is some fluid behind it.  Use the nasal spray that I have sent to the pharmacy and also the nasal lavage.  Consider taking over-the-counter Zyrtec the or Allegra-D as well.  Consider use of humidifier.  If you have any increased ear pain or fever you should be seen again.  You declined to be tested for COVID-19 today, but if you have any worsening symptoms you should consider being reexamined and tested at that time.    ED Prescriptions    Medication Sig Dispense Auth. Provider   ipratropium (ATROVENT) 0.06 % nasal spray Place 2 sprays into both nostrils 4 (four) times daily. 15 mL Danton Clap, PA-C     PDMP not reviewed this encounter.   Danton Clap, PA-C 12/13/20 1436

## 2020-12-13 NOTE — Discharge Instructions (Signed)
Your ear does not appear to be infected at this time.  There is some fluid behind it.  Use the nasal spray that I have sent to the pharmacy and also the nasal lavage.  Consider taking over-the-counter Zyrtec the or Allegra-D as well.  Consider use of humidifier.  If you have any increased ear pain or fever you should be seen again.  You declined to be tested for COVID-19 today, but if you have any worsening symptoms you should consider being reexamined and tested at that time.

## 2021-09-18 ENCOUNTER — Other Ambulatory Visit: Payer: Self-pay

## 2021-09-18 ENCOUNTER — Emergency Department: Payer: PRIVATE HEALTH INSURANCE

## 2021-09-18 ENCOUNTER — Emergency Department
Admission: EM | Admit: 2021-09-18 | Discharge: 2021-09-18 | Disposition: A | Discharge status: Home / Self Care | Payer: PRIVATE HEALTH INSURANCE | Attending: Emergency Medicine | Admitting: Emergency Medicine

## 2021-09-18 DIAGNOSIS — Z20822 Contact with and (suspected) exposure to covid-19: Secondary | ICD-10-CM | POA: Diagnosis not present

## 2021-09-18 DIAGNOSIS — J40 Bronchitis, not specified as acute or chronic: Secondary | ICD-10-CM | POA: Diagnosis not present

## 2021-09-18 DIAGNOSIS — F17200 Nicotine dependence, unspecified, uncomplicated: Secondary | ICD-10-CM | POA: Diagnosis not present

## 2021-09-18 DIAGNOSIS — Z720 Tobacco use: Secondary | ICD-10-CM

## 2021-09-18 DIAGNOSIS — M7918 Myalgia, other site: Secondary | ICD-10-CM | POA: Diagnosis present

## 2021-09-18 LAB — RESP PANEL BY RT-PCR (FLU A&B, COVID) ARPGX2
Influenza A by PCR: NEGATIVE
Influenza B by PCR: NEGATIVE
SARS Coronavirus 2 by RT PCR: NEGATIVE

## 2021-09-18 MED ORDER — BENZONATATE 100 MG PO CAPS
100.0000 mg | ORAL_CAPSULE | Freq: Once | ORAL | Status: AC
  Administered 2021-09-18: 100 mg via ORAL
  Filled 2021-09-18: qty 1

## 2021-09-18 MED ORDER — BENZONATATE 100 MG PO CAPS: 100.0000 mg | ORAL_CAPSULE | Freq: Three times a day (TID) | ORAL | 0 refills | Status: AC | PRN

## 2021-09-18 NOTE — ED Provider Notes (Signed)
[facility] Provider Note    Event Date/Time   First MD Initiated Contact with Patient 09/18/21 1106     (approximate)   History   Generalized Body Aches   HPI  Lynn Chavez is a 46 y.o. 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  Tussive chest pain.  Patient states he vomited yesterday but has not vomited today and does not feel nauseous.  No urinary symptoms or diarrhea.  No abdominal pain, shortness of breath, earache, sore throat, rash or extremity pain.  Over-the-counter cold medicine but does not feel it helped much.  No other acute concerns at this time other than some change in things taste stating things taste more metallic.       Physical Exam  Triage Vital Signs: ED Triage Vitals  Enc Vitals Group     BP 09/18/21 1040 135/71     Pulse Rate 09/18/21 1040 83     Resp 09/18/21 1040 18     Temp 09/18/21 1040 98.6 F (37 C)     Temp Source 09/18/21 1040 Oral     SpO2 09/18/21 1040 98 %     Weight 09/18/21 1104 164 lb 0.4 oz (74.4 kg)     Height 09/18/21 1104 5\' 3"  (1.6 m)     Head Circumference --      Peak Flow --      Pain Score 09/18/21 1023 3     Pain Loc --      Pain Edu? --      Excl. in [place]? --     Most recent vital signs: Vitals:   09/18/21 1040  BP: 135/71  Pulse: 83  Resp: 18  Temp: 98.6 F (37 C)  SpO2: 98%    General: Awake, no distress.  CV:  Good peripheral perfusion.  Resp:  Normal effort.  Clear bilaterally. Abd:  No distention.  Soft throughout Other:  Exception of left eye which is slightly laterally deviated and baseline for patient cranial 2-12 are grossly intact.  Oropharynx is unremarkable.  She has no neck stiffness.   ED Results / Procedures /  Treatments  Labs (all labs ordered are listed, but only abnormal results are displayed) Labs Reviewed  RESP PANEL BY RT-PCR (FLU A&B, COVID) [id number]     EKG  EKG shows sinus rhythm with a ventricular rate of 66 without evidence of acute ischemia or significant arrhythmia.  Normal axis.   RADIOLOGY Chest reviewed by myself shows no focal consoidation, effusion, edema, pneumothorax or other clear acute thoracic process. I also reviewed radiology interpretation and agree with findings described.    PROCEDURES:     MEDICATIONS ORDERED IN ED: Medications  benzonatate (TESSALON) capsule 100 mg (100 mg Oral Given 09/18/21 1145)     IMPRESSION / MDM / ASSESSMENT AND PLAN / ED COURSE  I reviewed the triage vital signs and the nursing notes.                              Differential diagnosis includes, but is not limited to URI, bacterial pneumonia and possible ACS.  No evidence of deep space infection on exam of the head or neck and patient does  not otherwise appear septic or meningitic.  Low suspicion for other significant metabolic arrangement.  Chest x-ray is unremarkable for evidence of bacterial pneumonia but shows some findings consistent with bronchitis.  No other acute thoracic process.  EKG is unremarkable for evidence of ischemia I would suggest ACS and myocarditis.  Overall history and exam as well as work-up is most consistent with acute viral bronchitis.  Patient counseled on tobacco cessation.  There is no wheezing or evidence of respiratory stress or hypoxia to suggest obstructive airway disease exacerbation.  Recommended supportive care and close outpatient follow-up.  Discharged in stable condition.  Strict return precautions advised and discussed.     FINAL CLINICAL IMPRESSION(S) / ED DIAGNOSES   Final diagnoses:  Bronchitis  Tobacco abuse     Rx / DC Orders   ED Discharge Orders          Ordered    benzonatate (TESSALON PERLES) 100 MG capsule  3 times  daily PRN        09/18/21 1219             Note:  This document was prepared using Dragon voice recognition software and may include unintentional dictation errors.   Lucrezia Starch, MD 09/18/21 [phone]

## 2021-09-18 NOTE — ED Triage Notes (Signed)
Pt come with c/o generalized body aches and flu like symptoms.

## 2021-10-18 ENCOUNTER — Ambulatory Visit
Admission: EM | Admit: 2021-10-18 | Discharge: 2021-10-18 | Disposition: A | Discharge status: Home / Self Care | Payer: No Typology Code available for payment source | Attending: Emergency Medicine | Admitting: Emergency Medicine

## 2021-10-18 ENCOUNTER — Other Ambulatory Visit: Payer: Self-pay

## 2021-10-18 DIAGNOSIS — J069 Acute upper respiratory infection, unspecified: Secondary | ICD-10-CM | POA: Diagnosis not present

## 2021-10-18 DIAGNOSIS — H66002 Acute suppurative otitis media without spontaneous rupture of ear drum, left ear: Secondary | ICD-10-CM | POA: Diagnosis not present

## 2021-10-18 MED ORDER — DOXYCYCLINE HYCLATE 100 MG PO CAPS: 100.0000 mg | ORAL_CAPSULE | Freq: Two times a day (BID) | ORAL | 0 refills | Status: DC

## 2021-10-18 MED ORDER — IPRATROPIUM BROMIDE 0.06 % NA SOLN: 2.0000 | Freq: Four times a day (QID) | NASAL | 12 refills | Status: DC

## 2021-10-18 MED ORDER — ONDANSETRON 8 MG PO TBDP: 8.0000 mg | ORAL_TABLET | Freq: Three times a day (TID) | ORAL | 0 refills | Status: DC | PRN

## 2021-10-18 NOTE — Discharge Instructions (Signed)
Take the Doxycycline twice daily for 10 days with food for treatment of your ear infection. ? ?Take an over-the-counter probiotic 1 hour after each dose of antibiotic to prevent diarrhea. ? ?Use over-the-counter Tylenol and ibuprofen as needed for pain or fever. ? ?Use the Atrovent nasal spray, 2 squirts in each nostril every 6 hours, as needed for runny nose and postnasal drip. ? ?Place a hot water bottle, or heating pad, underneath your pillowcase at night to help dilate up your ear and aid in pain relief as well as resolution of the infection. ? ?Return for reevaluation for any new or worsening symptoms.  ?

## 2021-10-18 NOTE — ED Provider Notes (Signed)
?[place] ? ? ? ?CSN: [id number] ?Arrival date & time: 10/18/21  1630 ? ? ?  ? ?History   ?Chief Complaint ?Chief Complaint  ?Patient presents with  ? Fever  ? Nasal Congestion  ? Otalgia  ? Headache  ? ? ?HPI ?Lynn Chavez is a 46 y.o. female.  ? ?HPI ? ?46 year old female here for evaluation of respiratory complaints. ? ?Patient reports that for last 3 days she has been experiencing nasal congestion, sinus pressure, fever with a Tmax of 100.8, left ear pain, and sore throat.  She also has been experiencing chills.  No nasal discharge or cough. ? ?Past Medical History:  ?Diagnosis Date  ? Asthma   ? Brain tumor ([place])   ? Cervicalgia   ? Genital herpes   ? ? ?There are no problems to display for this patient. ? ? ?Past Surgical History:  ?Procedure Laterality Date  ? ABDOMINAL HYSTERECTOMY    ? CRANIOTOMY  04/2013  ? ? ?OB History   ? ? Gravida  ?1  ? Para  ?   ? Term  ?   ? Preterm  ?   ? AB  ?   ? Living  ?1  ?  ? ? SAB  ?   ? IAB  ?   ? Ectopic  ?   ? Multiple  ?   ? Live Births  ?1  ?   ?  ?  ? ? ? ?Home Medications   ? ?Prior to Admission medications   ?Medication Sig Start Date End Date Taking? Authorizing Provider  ?doxycycline (VIBRAMYCIN) 100 MG capsule Take 1 capsule (100 mg total) by mouth 2 (two) times daily. 10/18/21  Yes Margarette Canada, NP  ?ipratropium (ATROVENT) 0.06 % nasal spray Place 2 sprays into both nostrils 4 (four) times daily. 10/18/21  Yes Margarette Canada, NP  ?ondansetron (ZOFRAN-ODT) 4 MG disintegrating tablet Take 1 tablet (4 mg total) by mouth every 8 (eight) hours as needed for nausea or vomiting. 09/28/20   Zigmund Gottron, NP  ?albuterol (VENTOLIN HFA) 108 (90 Base) MCG/ACT inhaler Inhale 2 puffs into the lungs every 6 (six) hours as needed for wheezing or shortness of breath. 09/23/19 05/10/20  Norval Gable, MD  ? ? ?Family History ?Family History  ?Problem Relation Age of Onset  ? Diabetes Father   ? Diabetes Paternal Grandmother   ? Hypotension Mother   ? Heart  disease Mother   ? ? ?Social History ?Social History  ? ?Tobacco Use  ? Smoking status: Every Day  ?  Packs/day: 0.25  ?  Years: 5.00  ?  Pack years: 1.25  ?  Types: Cigarettes  ? Smokeless tobacco: Never  ?Vaping Use  ? Vaping Use: Never used  ?Substance Use Topics  ? Alcohol use: Yes  ?  Comment: rare  ? Drug use: No  ? ? ? ?Allergies   ?Adhesive  [tape], Morphine, and Penicillins ? ? ?Review of Systems ?Review of Systems  ?Constitutional:  Positive for fever.  ?HENT:  Positive for congestion, ear pain, sinus pressure and sore throat. Negative for rhinorrhea.   ?Respiratory:  Negative for cough.   ?Skin:  Negative for rash.  ?Hematological: Negative.   ?Psychiatric/Behavioral: Negative.    ? ? ?Physical Exam ?Triage Vital Signs ?ED Triage Vitals  ?Enc Vitals Group  ?   BP 10/18/21 1706 127/74  ?   Pulse Rate 10/18/21 1706 97  ?   Resp 10/18/21 1706 16  ?  Temp --   ?   Temp Source 10/18/21 1706 Oral  ?   SpO2 10/18/21 1706 98 %  ?   Weight --   ?   Height --   ?   Head Circumference --   ?   Peak Flow --   ?   Pain Score 10/18/21 1707 8  ?   Pain Loc --   ?   Pain Edu? --   ?   Excl. in [place]? --   ? ?No data found. ? ?Updated Vital Signs ?BP 127/74 (BP Location: Left Arm)   Pulse 97   Temp 98.4 ?F (36.9 ?C) (Oral)   Resp 16   LMP 11/04/2015 (Exact Date)   SpO2 98%  ? ?Visual Acuity ?Right Eye Distance:   ?Left Eye Distance:   ?Bilateral Distance:   ? ?Right Eye Near:   ?Left Eye Near:    ?Bilateral Near:    ? ?Physical Exam ?Vitals and nursing note reviewed.  ?Constitutional:   ?   Appearance: Normal appearance. She is not ill-appearing.  ?HENT:  ?   Head: Normocephalic and atraumatic.  ?   Right Ear: Tympanic membrane, ear canal and external ear normal. There is no impacted cerumen.  ?   Left Ear: Ear canal and external ear normal. There is no impacted cerumen.  ?   Nose: Congestion and rhinorrhea present.  ?   Mouth/Throat:  ?   Mouth: Mucous membranes are moist.  ?   Pharynx: Posterior oropharyngeal  erythema present.  ?Cardiovascular:  ?   Rate and Rhythm: Normal rate and regular rhythm.  ?   Pulses: Normal pulses.  ?   Heart sounds: Normal heart sounds. No murmur heard. ?  No friction rub. No gallop.  ?Pulmonary:  ?   Effort: Pulmonary effort is normal.  ?   Breath sounds: Normal breath sounds. No wheezing, rhonchi or rales.  ?Musculoskeletal:  ?   Cervical back: Normal range of motion and neck supple.  ?Lymphadenopathy:  ?   Cervical: No cervical adenopathy.  ?Skin: ?   General: Skin is warm and dry.  ?   Capillary Refill: Capillary refill takes less than 2 seconds.  ?   Findings: No erythema or rash.  ?Neurological:  ?   General: No focal deficit present.  ?   Mental Status: She is alert and oriented to person, place, and time.  ?Psychiatric:     ?   Mood and Affect: Mood normal.     ?   Behavior: Behavior normal.     ?   Thought Content: Thought content normal.     ?   Judgment: Judgment normal.  ? ? ? ?UC Treatments / Results  ?Labs ?(all labs ordered are listed, but only abnormal results are displayed) ?Labs Reviewed - No data to display ? ?EKG ? ? ?Radiology ?No results found. ? ?Procedures ?Procedures (including critical care time) ? ?Medications Ordered in UC ?Medications - No data to display ? ?Initial Impression / Assessment and Plan / UC Course  ?I have reviewed the triage vital signs and the nursing notes. ? ?Pertinent labs & imaging results that were available during my care of the patient were reviewed by me and considered in my medical decision making (see chart for details). ? ?Patient is a nontoxic-appearing 46 year old female here for evaluation of respiratory symptoms as outlined in HPI above.  Her physical exam reveals an erythematous and injected left tympanic membrane.  The external auditory canal is  clear.  Right TM is pearly gray in appearance with normal light reflex and clear external auditory canal.  Nasal mucosa is erythematous and edematous with clear discharge in both nares.   Oropharyngeal exam reveals mild posterior oropharyngeal erythema with clear postnasal drip.  No cervical lymphadenopathy appreciated exam.  Cardiopulmonary exam feels clear lung sounds in all fields.  Patient exam is consistent with an upper respiratory infection and otitis media.  She has an allergy to penicillin and I will place her on doxycycline twice daily for treatment of her otitis media and upper respiratory infection.  She is unsure if she is ever had a cephalosporin in the past.  So that is why I am selecting the doxycycline.  [place] of Atrovent nasal spray to help with the nasal congestion.  Work note provided. ? ? ?Final Clinical Impressions(s) / UC Diagnoses  ? ?Final diagnoses:  ?Upper respiratory tract infection, unspecified type  ?Non-recurrent acute suppurative otitis media of left ear without spontaneous rupture of tympanic membrane  ? ? ? ?Discharge Instructions   ? ?  ?Take the Doxycycline twice daily for 10 days with food for treatment of your ear infection. ? ?Take an over-the-counter probiotic 1 hour after each dose of antibiotic to prevent diarrhea. ? ?Use over-the-counter Tylenol and ibuprofen as needed for pain or fever. ? ?Use the Atrovent nasal spray, 2 squirts in each nostril every 6 hours, as needed for runny nose and postnasal drip. ? ?Place a hot water bottle, or heating pad, underneath your pillowcase at night to help dilate up your ear and aid in pain relief as well as resolution of the infection. ? ?Return for reevaluation for any new or worsening symptoms.  ? ? ? ? ?ED Prescriptions   ? ? Medication Sig Dispense Auth. Provider  ? doxycycline (VIBRAMYCIN) 100 MG capsule Take 1 capsule (100 mg total) by mouth 2 (two) times daily. 20 capsule Margarette Canada, NP  ? ipratropium (ATROVENT) 0.06 % nasal spray Place 2 sprays into both nostrils 4 (four) times daily. 15 mL Margarette Canada, NP  ? ?  ? ?PDMP not reviewed this encounter. ?  ?Margarette Canada, NP ?10/18/21 1719 ? ?

## 2021-10-18 NOTE — ED Triage Notes (Signed)
Patient presents to Urgent Care with complaints of fever, nasal congestion, chills x 2 days. Treating symptoms with tylenol.  ?

## 2021-11-02 ENCOUNTER — Encounter: Payer: Self-pay | Admitting: Obstetrics

## 2021-12-07 ENCOUNTER — Encounter: Payer: Self-pay | Admitting: Emergency Medicine

## 2021-12-07 ENCOUNTER — Other Ambulatory Visit: Payer: Self-pay

## 2021-12-07 ENCOUNTER — Ambulatory Visit
Admission: EM | Admit: 2021-12-07 | Discharge: 2021-12-07 | Disposition: A | Discharge status: Home / Self Care | Payer: No Typology Code available for payment source

## 2021-12-07 DIAGNOSIS — T2122XA Burn of second degree of abdominal wall, initial encounter: Secondary | ICD-10-CM | POA: Diagnosis not present

## 2021-12-07 MED ORDER — SILVER SULFADIAZINE 1 % EX CREA: 1.0000 "application " | TOPICAL_CREAM | Freq: Every day | CUTANEOUS | 0 refills | Status: DC

## 2021-12-07 NOTE — ED Provider Notes (Signed)
? ? ?[facility] ? ? ? ?CSN: [id number] ?Arrival date & time: 12/07/21  1633 ? ? ?  ? ?History   ?Chief Complaint ?Chief Complaint  ?Patient presents with  ? Burn  ? ? ?HPI ?Lynn Chavez is a 46 y.o. female.  ? ?Patient presents with a burn to the center of abdomen for 4 days.  Endorses that she was cooking when bowling hot water splashed onto her stomach.  Has been cleansing area with peroxide, water and applying Neosporin.  Endorses that blisters are present and have drained a yellowish to clear fluid as well as her popping one of the blisters..  Has been covering with a nonadherent dressing but when she removed her shirt part of the wound bed was removed .  Patient endorses a soreness but denies pain.  Nuys any purulent drainage, fever or chills. ? ?Past Medical History:  ?Diagnosis Date  ? Asthma   ? Brain tumor ([place])   ? Cervicalgia   ? Genital herpes   ? ? ?There are no problems to display for this patient. ? ? ?Past Surgical History:  ?Procedure Laterality Date  ? ABDOMINAL HYSTERECTOMY    ? CRANIOTOMY  04/2013  ? ? ?OB History   ? ? Gravida  ?1  ? Para  ?   ? Term  ?   ? Preterm  ?   ? AB  ?   ? Living  ?1  ?  ? ? SAB  ?   ? IAB  ?   ? Ectopic  ?   ? Multiple  ?   ? Live Births  ?1  ?   ?  ?  ? ? ? ?Home Medications   ? ?Prior to Admission medications   ?Medication Sig Start Date End Date Taking? Authorizing Provider  ?cetirizine (ZYRTEC) 10 MG tablet Take 10 mg by mouth daily.   Yes [provider]  ?silver sulfADIAZINE (SILVADENE) 1 % cream Apply 1 application. topically daily. 12/07/21  Yes Ima Hafner, Leitha Schuller, NP  ?doxycycline (VIBRAMYCIN) 100 MG capsule Take 1 capsule (100 mg total) by mouth 2 (two) times daily. 10/18/21   Margarette Canada, NP  ?ipratropium (ATROVENT) 0.06 % nasal spray Place 2 sprays into both nostrils 4 (four) times daily. 10/18/21   Margarette Canada, NP  ?ondansetron (ZOFRAN-ODT) 8 MG disintegrating tablet Take 1 tablet (8 mg total) by mouth every 8 (eight) hours as  needed for nausea or vomiting. 10/18/21   Margarette Canada, NP  ?albuterol (VENTOLIN HFA) 108 (90 Base) MCG/ACT inhaler Inhale 2 puffs into the lungs every 6 (six) hours as needed for wheezing or shortness of breath. 09/23/19 05/10/20  Norval Gable, MD  ? ? ?Family History ?Family History  ?Problem Relation Age of Onset  ? Diabetes Father   ? Diabetes Paternal Grandmother   ? Hypotension Mother   ? Heart disease Mother   ? ? ?Social History ?Social History  ? ?Tobacco Use  ? Smoking status: Former  ?  Packs/day: 0.25  ?  Years: 5.00  ?  Pack years: 1.25  ?  Types: Cigarettes  ? Smokeless tobacco: Never  ? Tobacco comments:  ?  Quit smoking 4 months ago.   ?Vaping Use  ? Vaping Use: Never used  ?Substance Use Topics  ? Alcohol use: Yes  ?  Comment: rare  ? Drug use: No  ? ? ? ?Allergies   ?Adhesive  [tape], Morphine, and Penicillins ? ? ?Review of Systems ?Review of Systems  ?  Constitutional: Negative.   ?Respiratory: Negative.    ?Cardiovascular: Negative.   ?Skin:  Positive for wound. Negative for color change, pallor and rash.  ?Neurological: Negative.   ? ? ?Physical Exam ?Triage Vital Signs ?ED Triage Vitals  ?Enc Vitals Group  ?   BP 12/07/21 1703 130/65  ?   Pulse Rate 12/07/21 1703 83  ?   Resp 12/07/21 1703 18  ?   Temp 12/07/21 1703 97.9 ?F (36.6 ?C)  ?   Temp Source 12/07/21 1703 Oral  ?   SpO2 12/07/21 1703 99 %  ?   Weight 12/07/21 1700 164 lb 0.4 oz (74.4 kg)  ?   Height 12/07/21 1700 '5\' 3"'$  (1.6 m)  ?   Head Circumference --   ?   Peak Flow --   ?   Pain Score 12/07/21 1659 6  ?   Pain Loc --   ?   Pain Edu? --   ?   Excl. in [place]? --   ? ?No data found. ? ?Updated Vital Signs ?BP 130/65 (BP Location: Right Arm)   Pulse 83   Temp 97.9 ?F (36.6 ?C) (Oral)   Resp 18   Ht '5\' 3"'$  (1.6 m)   Wt 164 lb 0.4 oz (74.4 kg)   LMP 11/04/2015 (Exact Date)   SpO2 99%   BMI 29.06 kg/m?  ? ?Visual Acuity ?Right Eye Distance:   ?Left Eye Distance:   ?Bilateral Distance:   ? ?Right Eye Near:   ?Left Eye Near:    ?Bilateral  Near:    ? ?Physical Exam ?Constitutional:   ?   Appearance: Normal appearance.  ?HENT:  ?   Head: Normocephalic.  ?Eyes:  ?   Extraocular Movements: Extraocular movements intact.  ?Pulmonary:  ?   Effort: Pulmonary effort is normal.  ?Skin: ?   Comments: Partial-thickness burn over the epigastric region of abdomen, defer to photos below  ?Neurological:  ?   Mental Status: She is alert and oriented to person, place, and time. Mental status is at baseline.  ?Psychiatric:     ?   Mood and Affect: Mood normal.     ?   Behavior: Behavior normal.  ? ? ? ? ?UC Treatments / Results  ?Labs ?(all labs ordered are listed, but only abnormal results are displayed) ?Labs Reviewed - No data to display ? ?EKG ? ? ?Radiology ?No results found. ? ?Procedures ?Procedures (including critical care time) ? ?Medications Ordered in UC ?Medications - No data to display ? ?Initial Impression / Assessment and Plan / UC Course  ?I have reviewed the triage vital signs and the nursing notes. ? ?Pertinent labs & imaging results that were available during my care of the patient were reviewed by me and considered in my medical decision making (see chart for details). ? ?Partial thickness burn of abdomen, initial encounter ? ?Wound cleansed, Silvadene applied with a nonadherent dressing in office, prescribed for outpatient use, recommended daily cleansing with normal hygiene with diluted soap and water, patting dry, covering with Silvadene and a nonadherent dressing for 7 days to promote healing,, may use Tylenol or ibuprofen for discomfort, given strict precautions for signs of infection and to return to urgent care at any point of occurrence, may follow-up as needed if concerns about healing  ? ?Final Clinical Impressions(s) / UC Diagnoses  ? ?Final diagnoses:  ?Partial thickness burn of abdomen, initial encounter  ? ?Discharge Instructions   ?None ?  ? ?ED Prescriptions   ? ?  Medication Sig Dispense Auth. Provider  ? silver sulfADIAZINE  (SILVADENE) 1 % cream Apply 1 application. topically daily. 50 g Hans Eden, NP  ? ?  ? ?PDMP not reviewed this encounter. ?  ?Hans Eden, NP ?12/07/21 1820 ? ?

## 2021-12-07 NOTE — Discharge Instructions (Addendum)
Your burn will heal with time, eventually it will scab over, you may feel tightness as area begins to scab, this is expected, you have a second degree burn,  These burns are very painful and cause the skin to be very red. The skin may also swell, leak fluid, look shiny, and start to have blisters. ? ?Cleanse daily with diluted soapy water, pat dry, apply cream and then cover with a non-Adherent dressing, complete daily for 7 days ? ?Please watch for signs of infection such as increased swelling, increased pain, increased redness, puslike drainage, fever or chills, if this occurs at any point please return for evaluation ? ?You may return at any point if you feel the area is not healing appropriately ?

## 2021-12-07 NOTE — ED Triage Notes (Signed)
Pt has burn on her center of her abdomen. She was cooking and boiling water slashed on her stomach. Accident occurred about 4 days ago.  ?

## 2022-08-31 ENCOUNTER — Ambulatory Visit
Admission: EM | Admit: 2022-08-31 | Discharge: 2022-08-31 | Disposition: A | Discharge status: Home / Self Care | Payer: PRIVATE HEALTH INSURANCE | Attending: Emergency Medicine | Admitting: Emergency Medicine

## 2022-08-31 DIAGNOSIS — J101 Influenza due to other identified influenza virus with other respiratory manifestations: Secondary | ICD-10-CM

## 2022-08-31 DIAGNOSIS — Z1152 Encounter for screening for COVID-19: Secondary | ICD-10-CM | POA: Insufficient documentation

## 2022-08-31 LAB — RESP PANEL BY RT-PCR (RSV, FLU A&B, COVID)  RVPGX2
Influenza A by PCR: NEGATIVE
Influenza B by PCR: POSITIVE — AB
Resp Syncytial Virus by PCR: NEGATIVE
SARS Coronavirus 2 by RT PCR: NEGATIVE

## 2022-08-31 LAB — BASIC METABOLIC PANEL
Anion gap: 8 (ref 5–15)
BUN: 12 mg/dL (ref 6–20)
CO2: 25 mmol/L (ref 22–32)
Calcium: 8.7 mg/dL — ABNORMAL LOW (ref 8.9–10.3)
Chloride: 103 mmol/L (ref 98–111)
Creatinine, Ser: 0.82 mg/dL (ref 0.44–1.00)
GFR, Estimated: >60 mL/min (ref >60)
Glucose, Bld: 106 mg/dL — ABNORMAL HIGH (ref 70–99)
Potassium: 3.8 mmol/L (ref 3.5–5.1)
Sodium: 136 mmol/L (ref 135–145)

## 2022-08-31 MED ORDER — OSELTAMIVIR PHOSPHATE 75 MG PO CAPS: 75.0000 mg | ORAL_CAPSULE | Freq: Two times a day (BID) | ORAL | 0 refills | Status: DC

## 2022-08-31 MED ORDER — IPRATROPIUM BROMIDE 0.06 % NA SOLN: 2.0000 | Freq: Four times a day (QID) | NASAL | 12 refills | Status: DC

## 2022-08-31 NOTE — Discharge Instructions (Addendum)
Take the Tamiflu twice daily for 5 days for treatment of influenza.  Use the Atrovent nasal spray, 2 squirts up each nostril every 6 hours, as needed for nasal congestion and runny nose.  Use over-the-counter Tylenol and ibuprofen according to package instructions as needed for pain or fever.  Return for reevaluation, or see your primary care provider, for new or worsening symptoms.

## 2022-08-31 NOTE — ED Provider Notes (Signed)
[facility]    CSN: [id number] Arrival date & time: 08/31/22  1144      History   Chief Complaint Chief Complaint  Patient presents with   Generalized Body Aches   Headache   Otalgia          HPI Lynn Chavez is a 47 y.o. female.   HPI  47 year old female here for evaluation of respiratory complaints.  The patient reports that her symptoms have been present for the last 2 days and they consist of fever, chills, body aches, and a left earache.  This is also consistent with nasal congestion and clear nasal discharge.  She has not had a cough.  She does have a history of meningioma and is being followed by [facility].  She was evaluated by them yesterday for increased head pressure as well as vision loss.  She has an MRI scheduled and she was also instructed to follow-up with her ophthalmologist for visual field testing.  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  Past Medical History:  Diagnosis Date   Asthma    Brain tumor ([place])    Cervicalgia    Genital herpes     There are no problems to display for this patient.   Past Surgical History:  Procedure Laterality Date   ABDOMINAL HYSTERECTOMY     CRANIOTOMY  04/2013    OB History     Gravida  1   Para      Term      Preterm      AB      Living  1      SAB      IAB      Ectopic      Multiple      Live Births  1            Home Medications    Prior to Admission medications   Medication Sig Start Date End Date Taking? Authorizing Provider  cetirizine (ZYRTEC) 10 MG tablet Take 10 mg by mouth daily.   Yes [provider]  ipratropium (ATROVENT) 0.06 % nasal spray Place 2 sprays into both nostrils 4 (four) times daily. 08/31/22  Yes Margarette Canada, NP  oseltamivir (TAMIFLU) 75 MG capsule Take 1 capsule (75 mg total) by mouth every 12 (twelve) hours. 08/31/22  Yes Margarette Canada, NP  albuterol (VENTOLIN HFA) 108 (90 Base)  MCG/ACT inhaler Inhale 2 puffs into the lungs every 6 (six) hours as needed for wheezing or shortness of breath. 09/23/19 05/10/20  Norval Gable, MD    Family History Family History  Problem Relation Age of Onset   Diabetes Father    Diabetes Paternal Grandmother    Hypotension Mother    Heart disease Mother     Social History Social History   Tobacco Use   Smoking status: Former    Packs/day: 0.25    Years: 5.00    Total pack years: 1.25    Types: Cigarettes   Smokeless tobacco: Never   Tobacco comments:    Quit smoking 4 months ago.   Vaping Use   Vaping Use: Never used  Substance Use Topics   Alcohol use: Yes    Comment: rare   Drug use: No     Allergies   Adhesive  [tape], Morphine, and Penicillins   Review of Systems Review of Systems  Constitutional:  Positive for chills and fever.  HENT:  Positive  for congestion, ear pain and rhinorrhea. Negative for sore throat.   Respiratory:  Negative for cough.   Musculoskeletal:  Positive for arthralgias and myalgias.  Neurological:  Positive for headaches.       Head pressure but this is not a new symptom.     Physical Exam Triage Vital Signs ED Triage Vitals  Enc Vitals Group     BP      Pulse      Resp      Temp      Temp src      SpO2      Weight      Height      Head Circumference      Peak Flow      Pain Score      Pain Loc      Pain Edu?      Excl. in [place]?    No data found.  Updated Vital Signs BP 118/85 (BP Location: Left Arm)   Pulse 72   Temp 99.6 F (37.6 C) (Oral)   Resp 18   Ht '5\' 3"'$  (1.6 m)   Wt 172 lb (78 kg)   LMP 11/04/2015 (Exact Date)   SpO2 99%   BMI 30.47 kg/m   Visual Acuity Right Eye Distance:   Left Eye Distance:   Bilateral Distance:    Right Eye Near:   Left Eye Near:    Bilateral Near:     Physical Exam Vitals and nursing note reviewed.  Constitutional:      Appearance: Normal appearance. She is not ill-appearing.  HENT:     Head: Normocephalic and  atraumatic.     Right Ear: Tympanic membrane, ear canal and external ear normal. There is no impacted cerumen.     Left Ear: Tympanic membrane, ear canal and external ear normal. There is no impacted cerumen.     Nose: Congestion and rhinorrhea present.     Comments: Nasal mucosa is markedly edematous with mild erythema and clear rhinorrhea.    Mouth/Throat:     Mouth: Mucous membranes are moist.     Pharynx: Oropharynx is clear. No oropharyngeal exudate or posterior oropharyngeal erythema.  Cardiovascular:     Rate and Rhythm: Normal rate and regular rhythm.     Pulses: Normal pulses.     Heart sounds: Normal heart sounds. No murmur heard.    No friction rub. No gallop.  Pulmonary:     Effort: Pulmonary effort is normal.     Breath sounds: No wheezing, rhonchi or rales.  Musculoskeletal:     Cervical back: Normal range of motion and neck supple.  Lymphadenopathy:     Cervical: No cervical adenopathy.  Skin:    General: Skin is warm and dry.     Capillary Refill: Capillary refill takes less than 2 seconds.     Findings: No rash.  Neurological:     General: No focal deficit present.     Mental Status: She is alert and oriented to person, place, and time.  Psychiatric:        Mood and Affect: Mood normal.        Behavior: Behavior normal.        Thought Content: Thought content normal.        Judgment: Judgment normal.      UC Treatments / Results  Labs (all labs ordered are listed, but only abnormal results are displayed) Labs Reviewed  RESP PANEL BY RT-PCR (RSV, FLU A&B, COVID)  RVPGX2 - Abnormal; Notable for the following components:      Result Value   Influenza B by PCR POSITIVE (*)    All other components within normal limits  BASIC METABOLIC PANEL - Abnormal; Notable for the following components:   Glucose, Bld 106 (*)    Calcium 8.7 (*)    All other components within normal limits    EKG   Radiology No results found.  Procedures Procedures (including  critical care time)  Medications Ordered in UC Medications - No data to display  Initial Impression / Assessment and Plan / UC Course  I have reviewed the triage vital signs and the nursing notes.  Pertinent labs & imaging results that were available during my care of the patient were reviewed by me and considered in my medical decision making (see chart for details).   Patient is a nontoxic-appearing 47 year old female who does have a significant past medical history to include meningioma, asthma, and is status post craniotomy in 2014 and abdominal hysterectomy presenting for a 2-day history of nasal congestion, discharge, left ear pain, body aches, and chills.  She is also been experiencing head pressure but this is not a new symptom.  She was evaluated by [facility] yesterday for ongoing head pressure x 4 to 5 months and also vision loss.  She has a MRI of her brain scheduled and she is following up with her ophthalmologist for visual field testing.  On exam patient has markedly edematous and slightly erythematous nasal mucosa with clear rhinorrhea.  Oropharyngeal exam is benign and cardiopulmonary exam reveals clear lung sounds in all fields.  Both tympanic membranes are pearly gray in appearance with normal light reflex and clear external auditory canal.  I suspect that her ear pain is secondary to her marked upper respiratory inflammation.  I will order a respiratory panel to look for the presence of COVID or influenza.  I am also can order a BMP as she has not had any lab work since July 2022.  If patient is COVID-positive I will prescribe Paxlovid.  BMP shows normal electrolytes with sodium 136 and potassium 3.8.  Is also normal and a GFR is greater than 60.  Patient's respiratory panel is positive for influenza B.  I will discharge patient home on Tamiflu 75 mg twice daily for treatment of influenza B.  I will also prescribe Atrovent nasal spray to help with the nasal  congestion.   Final Clinical Impressions(s) / UC Diagnoses   Final diagnoses:  Influenza B     Discharge Instructions      Take the Tamiflu twice daily for 5 days for treatment of influenza.  Use the Atrovent nasal spray, 2 squirts up each nostril every 6 hours, as needed for nasal congestion and runny nose.  Use over-the-counter Tylenol and ibuprofen according to package instructions as needed for pain or fever.  Return for reevaluation, or see your primary care provider, for new or worsening symptoms.      ED Prescriptions     Medication Sig Dispense Auth. Provider   ipratropium (ATROVENT) 0.06 % nasal spray Place 2 sprays into both nostrils 4 (four) times daily. 15 mL Margarette Canada, NP   oseltamivir (TAMIFLU) 75 MG capsule Take 1 capsule (75 mg total) by mouth every 12 (twelve) hours. 10 capsule Margarette Canada, NP      PDMP not reviewed this encounter.   Margarette Canada, NP 08/31/22 [phone]

## 2022-08-31 NOTE — ED Triage Notes (Signed)
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   Pt states that she is not worried about covid or flu and believes she has an ear infection.   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

## 2022-12-14 ENCOUNTER — Ambulatory Visit: Payer: No Typology Code available for payment source

## 2022-12-14 ENCOUNTER — Ambulatory Visit
Admission: EM | Admit: 2022-12-14 | Discharge: 2022-12-14 | Disposition: A | Discharge status: Home / Self Care | Payer: No Typology Code available for payment source | Attending: Emergency Medicine | Admitting: Emergency Medicine

## 2022-12-14 DIAGNOSIS — M778 Other enthesopathies, not elsewhere classified: Secondary | ICD-10-CM | POA: Diagnosis not present

## 2022-12-14 DIAGNOSIS — M67432 Ganglion, left wrist: Secondary | ICD-10-CM

## 2022-12-14 MED ORDER — PREDNISONE 20 MG PO TABS: 40.0000 mg | ORAL_TABLET | Freq: Every day | ORAL | 0 refills | Status: DC

## 2022-12-14 NOTE — ED Provider Notes (Signed)
[facility]    CSN: [id number] Arrival date & time: 12/14/22  1102      History   Chief Complaint Chief Complaint  Patient presents with   Wrist Pain         HPI Lynn Chavez is a 47 y.o. female.   Patient presents for evaluation of left forearm pain that has been constant present for 2 weeks.  Has noticed a bump to the left wrist, denies pain to the wrist, has not increased in size.  Endorses that she did bump the left wrist and the elbow against a [place] over the last 2 to 3 weeks.  Pain can be felt with turning of the arm and when pressure is applied to the hand and wrist.  Pain is described as a spasming that radiates upwards.  Denies numbness or tingling.  Has not attempted treatment.    Past Medical History:  Diagnosis Date   Asthma    Brain tumor (HCC)    Cervicalgia    Genital herpes     There are no problems to display for this patient.   Past Surgical History:  Procedure Laterality Date   ABDOMINAL HYSTERECTOMY     CRANIOTOMY  04/2013    OB History     Gravida  1   Para      Term      Preterm      AB      Living  1      SAB      IAB      Ectopic      Multiple      Live Births  1            Home Medications    Prior to Admission medications   Medication Sig Start Date End Date Taking? Authorizing Provider  cetirizine (ZYRTEC) 10 MG tablet Take 10 mg by mouth daily.   Yes [provider]  ipratropium (ATROVENT) 0.06 % nasal spray Place 2 sprays into both nostrils 4 (four) times daily. 08/31/22  Yes Becky Augusta, NP  oseltamivir (TAMIFLU) 75 MG capsule Take 1 capsule (75 mg total) by mouth every 12 (twelve) hours. 08/31/22   Becky Augusta, NP  albuterol (VENTOLIN HFA) 108 (90 Base) MCG/ACT inhaler Inhale 2 puffs into the lungs every 6 (six) hours as needed for wheezing or shortness of breath. 09/23/19 05/10/20  Payton Mccallum, MD    Family History Family History  Problem Relation Age of Onset    Diabetes Father    Diabetes Paternal Grandmother    Hypotension Mother    Heart disease Mother     Social History Social History   Tobacco Use   Smoking status: Some Days    Packs/day: 0.25    Years: 5.00    Additional pack years: 0.00    Total pack years: 1.25    Types: Cigarettes   Smokeless tobacco: Never   Tobacco comments:    Quit smoking 4 months ago.   Vaping Use   Vaping Use: Never used  Substance Use Topics   Alcohol use: Yes    Comment: rare   Drug use: No     Allergies   Adhesive  [tape], Morphine, and Penicillins   Review of Systems Review of Systems   Physical Exam Triage Vital Signs ED Triage Vitals  Enc Vitals Group     BP 12/14/22 1120 113/74     Pulse Rate 12/14/22 1120 75     Resp --  Temp 12/14/22 1120 98.4 F (36.9 C)     Temp Source 12/14/22 1120 Oral     SpO2 12/14/22 1120 95 %     Weight 12/14/22 1118 170 lb (77.1 kg)     Height 12/14/22 1118 5\' 3"  (1.6 m)     Head Circumference --      Peak Flow --      Pain Score 12/14/22 1118 6     Pain Loc --      Pain Edu? --      Excl. in GC? --    No data found.  Updated Vital Signs BP 113/74 (BP Location: Left Arm)   Pulse 75   Temp 98.4 F (36.9 C) (Oral)   Ht 5\' 3"  (1.6 m)   Wt 170 lb (77.1 kg)   LMP 11/04/2015 (Exact Date)   SpO2 95%   BMI 30.11 kg/m   Visual Acuity Right Eye Distance:   Left Eye Distance:   Bilateral Distance:    Right Eye Near:   Left Eye Near:    Bilateral Near:     Physical Exam Constitutional:      Appearance: Normal appearance.  Eyes:     Extraocular Movements: Extraocular movements intact.  Pulmonary:     Effort: Pulmonary effort is normal.  Musculoskeletal:     Comments: Tenderness present along the lateral aspect of the left forearm without ecchymosis swelling or deformity, no involvement of the left elbow, ganglion cyst present along the dorsum of the left wrist, left wrist has full range of motion, 2+ radial and brachial pulse   Neurological:     Mental Status: She is alert.      UC Treatments / Results  Labs (all labs ordered are listed, but only abnormal results are displayed) Labs Reviewed - No data to display  EKG   Radiology DG Wrist Complete Left  Result Date: 12/14/2022 CLINICAL DATA:  Pain with repeated motion EXAM: LEFT WRIST - COMPLETE 4 VIEW COMPARISON:  None Available. FINDINGS: There is no evidence of fracture or dislocation. There is no evidence of arthropathy or other focal bone abnormality. Soft tissue thickening dorsally about the wrist. Please correlate with clinical findings. IMPRESSION: Soft tissue thickening dorsally about the wrist. Electronically Signed   By: Karen Kays M.D.   On: 12/14/2022 12:00    Procedures Procedures (including critical care time)  Medications Ordered in UC Medications - No data to display  Initial Impression / Assessment and Plan / UC Course  I have reviewed the triage vital signs and the nursing notes.  Pertinent labs & imaging results that were available during my care of the patient were reviewed by me and considered in my medical decision making (see chart for details).  Forearm tendinitis, ganglion of left wrist  Left wrist x-rays negative, presentation is consistent with a benign cyst, discussed with patient and given written handout on condition, arm pain follows the tendon, most likely etiology, caused by overuse, prednisone burst prescribed for treatment, may use Tylenol additionally, recommended supportive measures through [facility], advise follow-up with orthopedics if symptoms continue to persist or worsen Final Clinical Impressions(s) / UC Diagnoses   Final diagnoses:  None   Discharge Instructions   None    ED Prescriptions   None    PDMP not reviewed this encounter.   Valinda Hoar, [place] 12/14/22 [phone]

## 2022-12-14 NOTE — Discharge Instructions (Addendum)
The cyst in your wrist is a ganglion cyst which is not harmful however can cause pain if it gets to big, more information is in your packet about this condition, may also follow-up with orthopedics, information is listed on front page for further management as needed  Area to the wrist is most likely irritation to the tendon due to repetitive motions of your work, it is good that you have already switched your cleaning supplies which will help recurrence  Begin prednisone every morning with food for 5 days to help reduce inflammation and ideally tone down the pain, may take Tylenol in addition to this  May use ice or heat over the affected area in 10 to 15-minute intervals  May massage and stretch as tolerated  If symptoms continue to persist or worsen you may follow-up with his urgent care or the orthopedic office for reevaluation

## 2022-12-14 NOTE — ED Triage Notes (Signed)
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   Pt states that gripping both hands made her hands "stuck". Pt states that when she extends her left arm, she feels pain in her bicep and forearm.   Pt has a raised mass on her left wrist that she can feel coming on her right wrist.   Pt asks for an xray of her left wrist.

## 2023-02-14 IMAGING — CR DG CHEST 2V
1 series · 2 of 2 positions shown · non-contrast
Comparison: 08/20/2017

CLINICAL DATA: Generalized body aches and flu like symptoms, some
burning in chest with coughing

EXAM:
CHEST - 2 VIEW

[Series 1: w chest pa · 0.14mm/px · 2 of 2 slices shown]
[im 1/2]
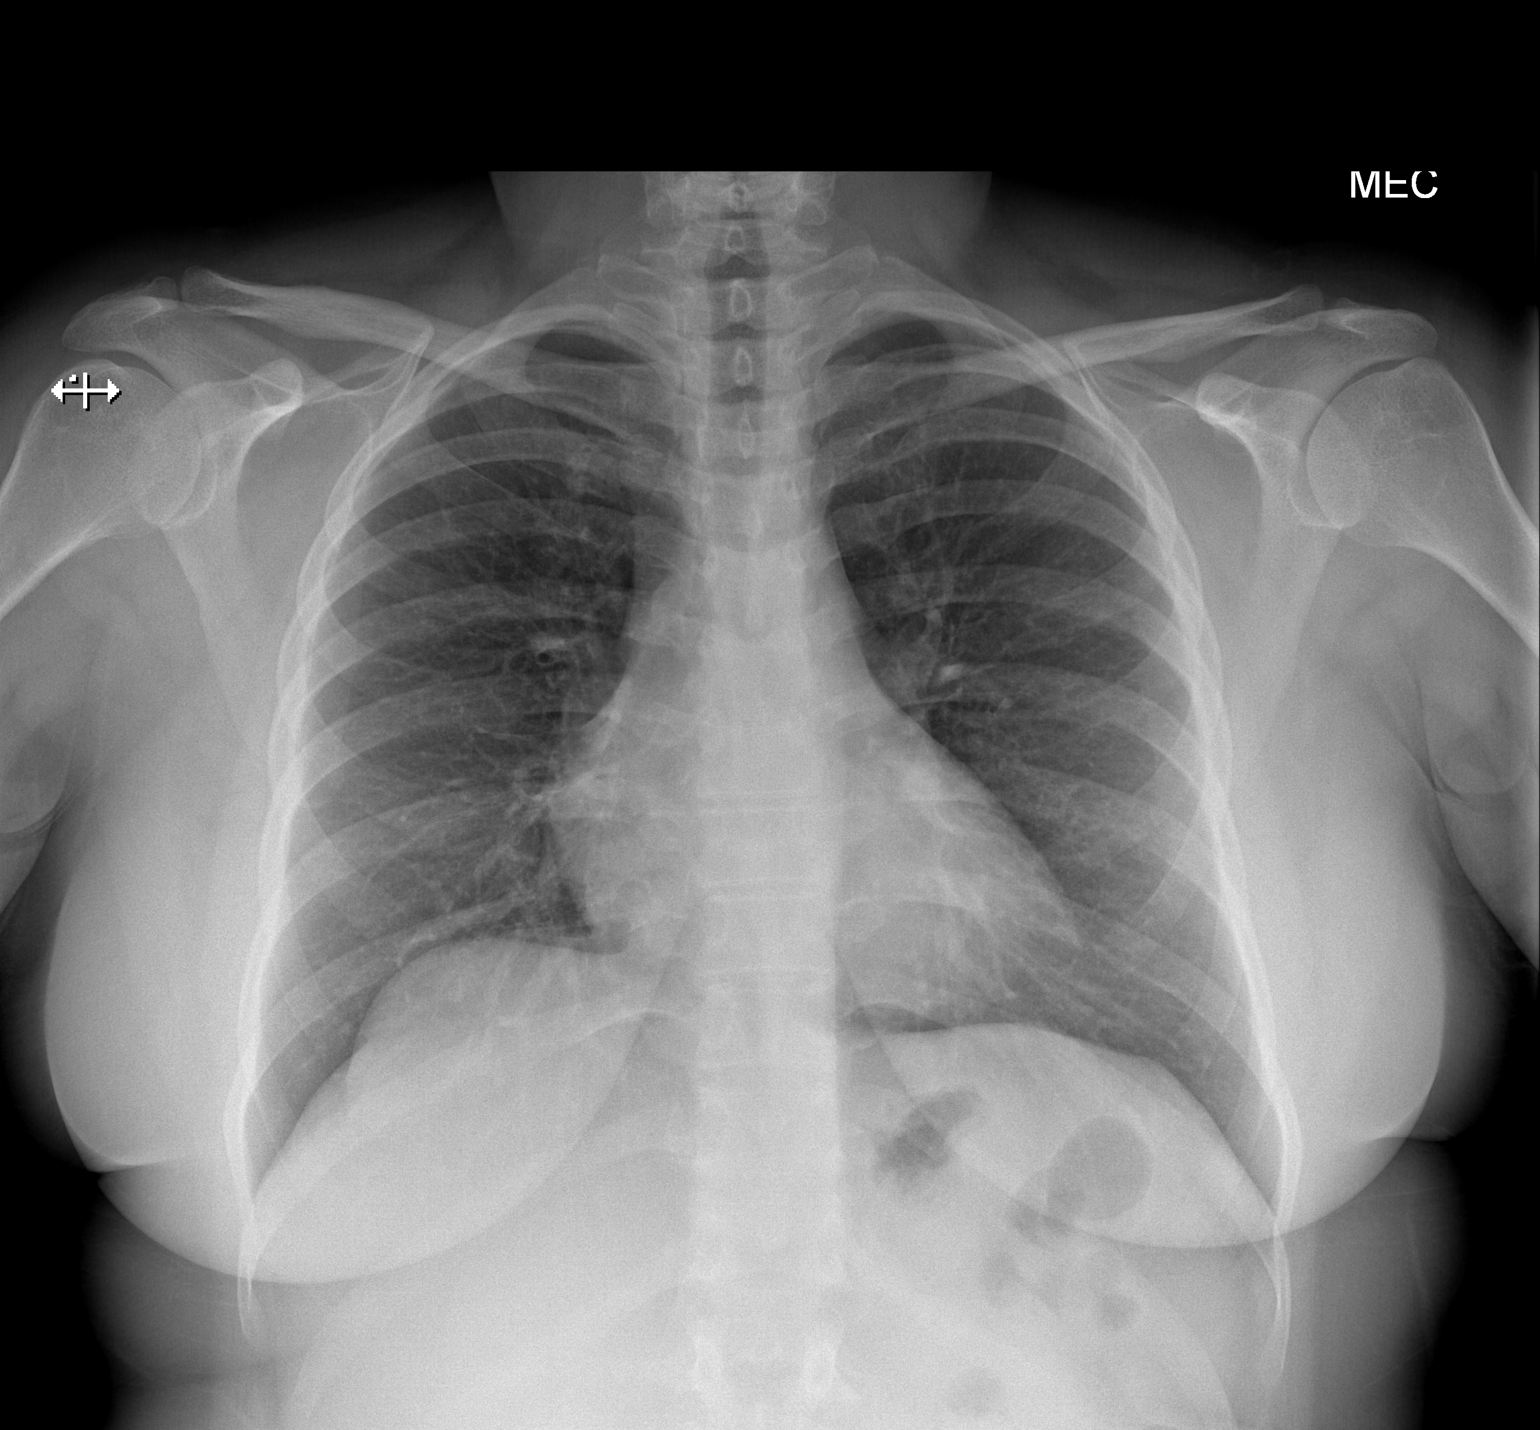
[im 2/2]
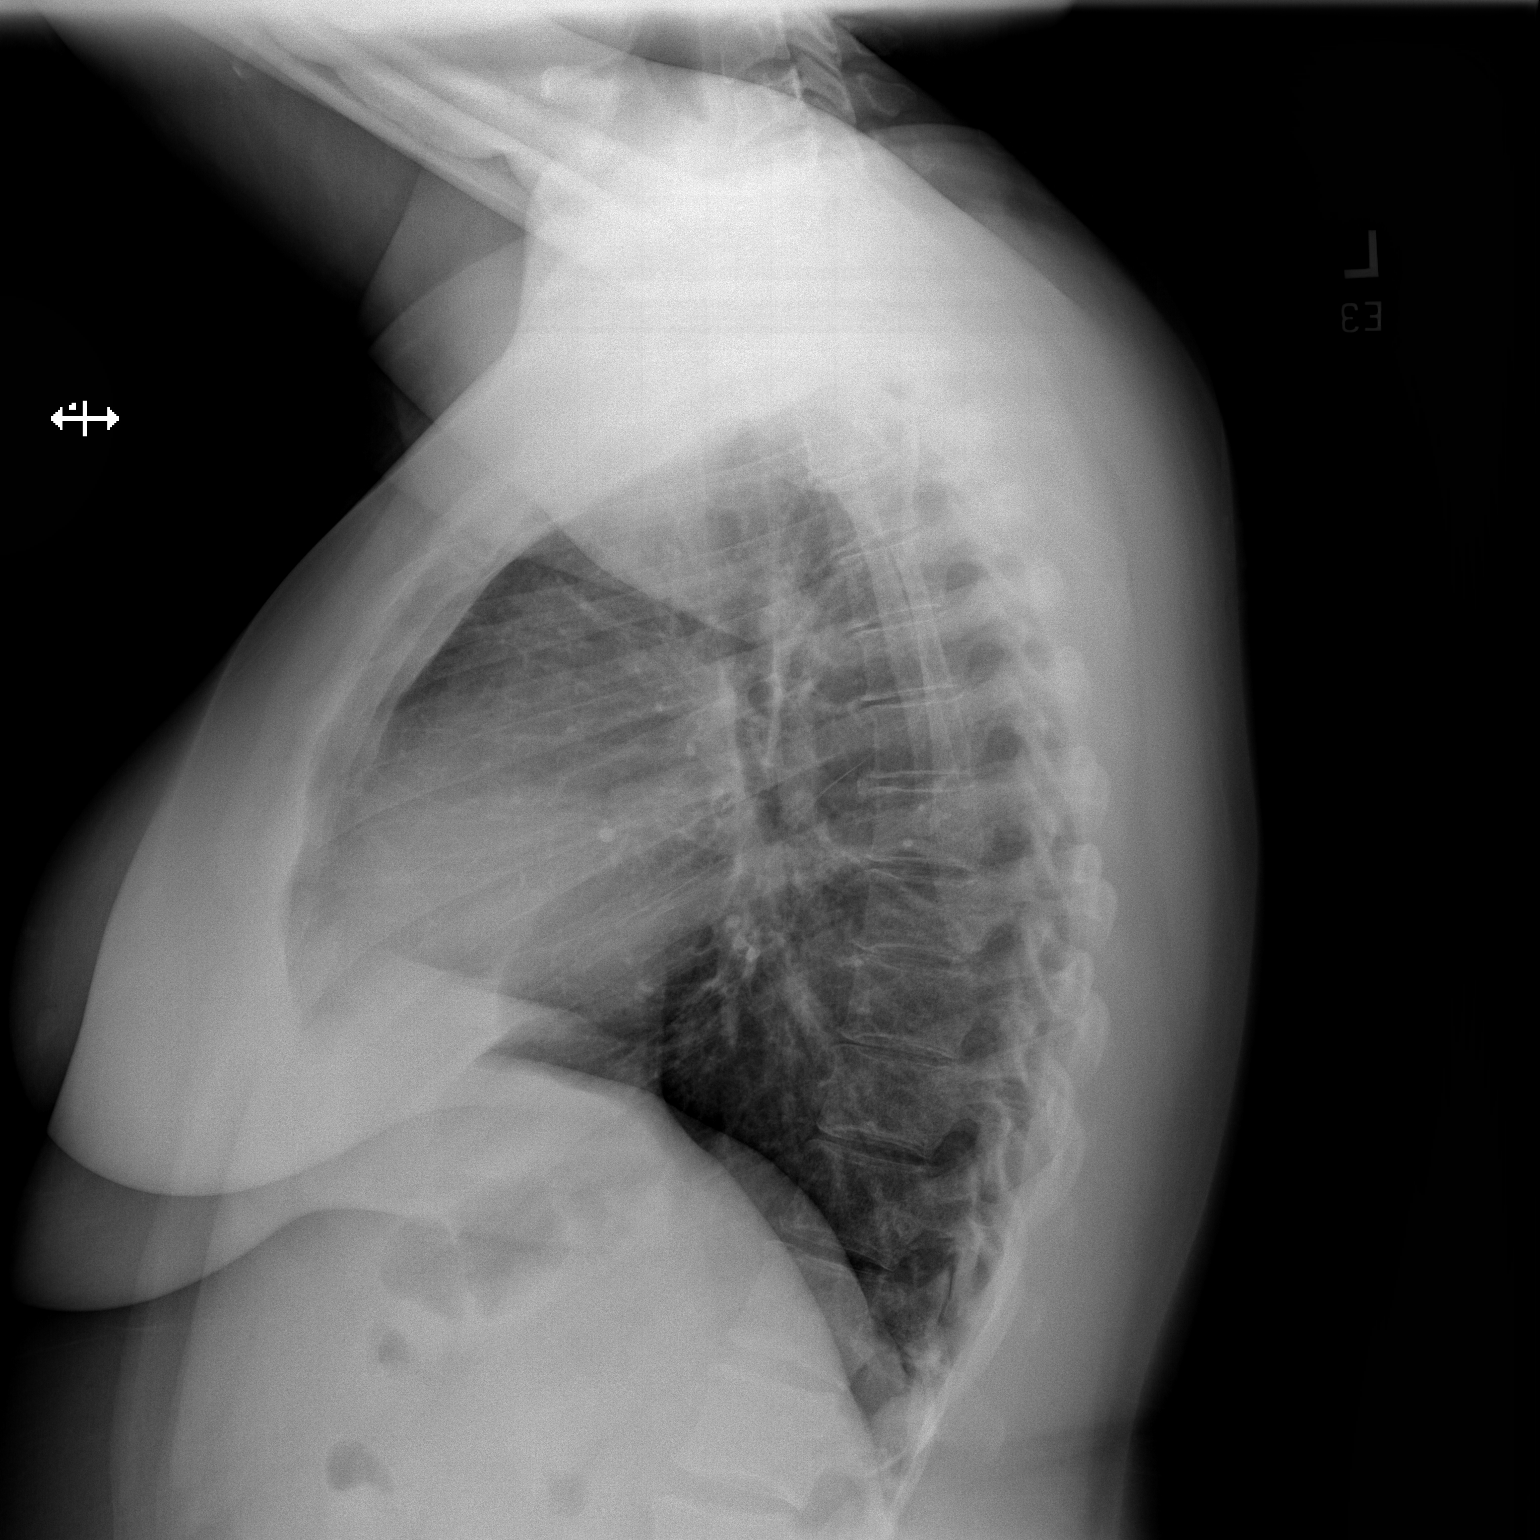

[2 of 2 positions shown; findings below may reference images not displayed]

FINDINGS: Normal heart size, mediastinal contours, and pulmonary vascularity.

Minimal peribronchial thickening.

No pulmonary infiltrate, pleural effusion, or pneumothorax.

RIGHT upper lobe infiltrate seen on previous exam resolved.

Osseous structures unremarkable.
IMPRESSION: Mild bronchitic changes with resolution of RIGHT upper lobe
infiltrates seen on previous exam.

No new abnormalities.

## 2023-04-01 ENCOUNTER — Ambulatory Visit
Admission: EM | Admit: 2023-04-01 | Discharge: 2023-04-01 | Disposition: A | Discharge status: Home / Self Care | Payer: PRIVATE HEALTH INSURANCE | Attending: Internal Medicine | Admitting: Internal Medicine

## 2023-04-01 DIAGNOSIS — H00014 Hordeolum externum left upper eyelid: Secondary | ICD-10-CM

## 2023-04-01 DIAGNOSIS — M778 Other enthesopathies, not elsewhere classified: Secondary | ICD-10-CM | POA: Diagnosis not present

## 2023-04-01 MED ORDER — ERYTHROMYCIN 5 MG/GM OP OINT: TOPICAL_OINTMENT | OPHTHALMIC | 0 refills | Status: DC

## 2023-04-01 MED ORDER — BETAMETHASONE DIPROPIONATE 0.05 % EX OINT: TOPICAL_OINTMENT | Freq: Two times a day (BID) | CUTANEOUS | 0 refills | Status: DC

## 2023-04-01 NOTE — ED Provider Notes (Signed)
[facility]    CSN: [id number] Arrival date & time: 04/01/23  1743      History   Chief Complaint Chief Complaint  Patient presents with   Eye Problem    HPI Lynn Chavez is a 47 y.o. female presents for evaluation of eye swelling and forearm pain.  Patient reports 2 days of left upper eyelid swelling with itching.  Denies any drainage, fevers, URI symptoms.  Denies any visual changes such as blurry vision.  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  She has not used any OTC medications for this.  Patient also reports she has a ganglion cyst to her left wrist that does cause tendinitis pain in her left forearm.  States she was seen for this in the past and was prescribed oral steroids which she states helped but she would prefer to try something topically.  She has not seen orthopedics regarding the cyst.  She states she is taken ibuprofen with minimal relief.  No other concerns at this time.  Eye Problem   Past Medical History:  Diagnosis Date   Asthma    Brain tumor (HCC)    Cervicalgia    Genital herpes     There are no problems to display for this patient.   Past Surgical History:  Procedure Laterality Date   ABDOMINAL HYSTERECTOMY     CRANIOTOMY  04/2013    OB History     Gravida  1   Para      Term      Preterm      AB      Living  1      SAB      IAB      Ectopic      Multiple      Live Births  1            Home Medications    Prior to Admission medications   Medication Sig Start Date End Date Taking? Authorizing Provider  betamethasone dipropionate (DIPROLENE) 0.05 % ointment Apply topically 2 (two) times daily. 04/01/23  Yes Radford Pax, NP  erythromycin ophthalmic ointment Place a 1/2 inch ribbon of ointment into the left upper eyelid 3 times a day for 7 days 04/01/23  Yes Radford Pax, NP  cetirizine (ZYRTEC) 10 MG tablet Take 10 mg by mouth daily.     [provider]  ipratropium (ATROVENT) 0.06 % nasal spray Place 2 sprays into both nostrils 4 (four) times daily. 08/31/22   Becky Augusta, NP  oseltamivir (TAMIFLU) 75 MG capsule Take 1 capsule (75 mg total) by mouth every 12 (twelve) hours. 08/31/22   Becky Augusta, NP  predniSONE (DELTASONE) 20 MG tablet Take 2 tablets (40 mg total) by mouth daily. 12/14/22   White, Elita Boone, NP  albuterol (VENTOLIN HFA) 108 (90 Base) MCG/ACT inhaler Inhale 2 puffs into the lungs every 6 (six) hours as needed for wheezing or shortness of breath. 09/23/19 05/10/20  Payton Mccallum, MD    Family History Family History  Problem Relation Age of Onset   Diabetes Father    Diabetes Paternal Grandmother    Hypotension Mother    Heart disease Mother     Social History Social History   Tobacco Use   Smoking status: Some Days    Current packs/day: 0.25    Average packs/day: 0.3 packs/day for 5.0 years (1.3 ttl pk-yrs)  Types: Cigarettes   Smokeless tobacco: Never   Tobacco comments:    Quit smoking 4 months ago.   Vaping Use   Vaping status: Never Used  Substance Use Topics   Alcohol use: Yes    Comment: rare   Drug use: No     Allergies   Adhesive  [tape], Morphine, and Penicillins   Review of Systems Review of Systems  Eyes:        Left upper eyelid swelling  Musculoskeletal:        Left forearm pain     Physical Exam Triage Vital Signs ED Triage Vitals  Encounter Vitals Group     BP 04/01/23 1839 117/75     Systolic BP Percentile --      Diastolic BP Percentile --      Pulse Rate 04/01/23 1839 88     Resp 04/01/23 1839 16     Temp 04/01/23 1839 99 F (37.2 C)     Temp Source 04/01/23 1839 Oral     SpO2 04/01/23 1839 98 %     Weight 04/01/23 1838 168 lb (76.2 kg)     Height 04/01/23 1838 5\' 3"  (1.6 m)     Head Circumference --      Peak Flow --      Pain Score 04/01/23 1841 4     Pain Loc --      Pain Education --      Exclude from Growth Chart --    No data  found.  Updated Vital Signs BP 117/75 (BP Location: Left Arm)   Pulse 88   Temp 99 F (37.2 C) (Oral)   Resp 16   Ht 5\' 3"  (1.6 m)   Wt 168 lb (76.2 kg)   LMP 11/04/2015 (Exact Date)   SpO2 98%   BMI 29.76 kg/m   Visual Acuity Right Eye Distance:   Left Eye Distance:   Bilateral Distance:    Right Eye Near:   Left Eye Near:    Bilateral Near:     Physical Exam Vitals and nursing note reviewed.  Constitutional:      General: She is not in acute distress.    Appearance: Normal appearance. She is not ill-appearing.  HENT:     Head: Normocephalic and atraumatic.  Eyes:     General:        Left eye: Hordeolum present.No foreign body or discharge.     Conjunctiva/sclera:     Left eye: Left conjunctiva is not injected. No chemosis, exudate or hemorrhage.    Pupils: Pupils are equal, round, and reactive to light.      Comments: No periorbital swelling or erythema.  Cardiovascular:     Rate and Rhythm: Normal rate.  Pulmonary:     Effort: Pulmonary effort is normal.  Musculoskeletal:     Comments: Mild tenderness to palpation along the lateral aspect of the left forearm without ecchymosis, swelling, erythema.  Ganglion cyst noted to the dorsum of the left wrist.  Full range of motion of wrist without restriction or pain.  Mild pain with rotation of the forearm.  Skin:    General: Skin is warm and dry.  Neurological:     General: No focal deficit present.     Mental Status: She is alert and oriented to person, place, and time.  Psychiatric:        Mood and Affect: Mood normal.        Behavior: Behavior normal.  UC Treatments / Results  Labs (all labs ordered are listed, but only abnormal results are displayed) Labs Reviewed - No data to display  EKG   Radiology No results found.  Procedures Procedures (including critical care time)  Medications Ordered in UC Medications - No data to display  Initial Impression / Assessment and Plan / UC Course  I  have reviewed the triage vital signs and the nursing notes.  Pertinent labs & imaging results that were available during my care of the patient were reviewed by me and considered in my medical decision making (see chart for details).     Reviewed exam and symptoms with patient.  No red flags.  Start urethritis and ointment for stye.  Continue warm compresses.  Will do topical betamethasone for the forearm and advised continued OTC NSAIDs.  PCP follow-up in 2 days for recheck.  ER precautions reviewed and patient verbalized understanding Final Clinical Impressions(s) / UC Diagnoses   Final diagnoses:  Hordeolum externum of left upper eyelid  Forearm tendonitis     Discharge Instructions      Start urethra mycin ointment as prescribed.  Warm compresses to the eye as needed.  You may use betamethasone topical steroid cream to your forearm as needed.  Follow-up with your PCP if your symptoms do not improve.  Please go to the ER for any worsening symptoms.  I hope you feel better soon!    ED Prescriptions     Medication Sig Dispense Auth. Provider   erythromycin ophthalmic ointment Place a 1/2 inch ribbon of ointment into the left upper eyelid 3 times a day for 7 days 3.5 g Radford Pax, NP   betamethasone dipropionate (DIPROLENE) 0.05 % ointment Apply topically 2 (two) times daily. 30 g Radford Pax, NP      PDMP not reviewed this encounter.   Radford Pax, NP 04/01/23 [phone]

## 2023-04-01 NOTE — ED Triage Notes (Signed)
Pt c/o L eye discomfort & redness x2 days. States partially blinded in L eye. Denies any trauma to eye. No otc tx.

## 2023-04-01 NOTE — Discharge Instructions (Addendum)
Start urethra mycin ointment as prescribed.  Warm compresses to the eye as needed.  You may use betamethasone topical steroid cream to your forearm as needed.  Follow-up with your PCP if your symptoms do not improve.  Please go to the ER for any worsening symptoms.  I hope you feel better soon!

## 2023-05-24 ENCOUNTER — Ambulatory Visit
Admission: EM | Admit: 2023-05-24 | Discharge: 2023-05-24 | Disposition: A | Discharge status: Home / Self Care | Payer: PRIVATE HEALTH INSURANCE | Attending: Internal Medicine | Admitting: Internal Medicine

## 2023-05-24 ENCOUNTER — Ambulatory Visit (INDEPENDENT_AMBULATORY_CARE_PROVIDER_SITE_OTHER): Payer: PRIVATE HEALTH INSURANCE

## 2023-05-24 DIAGNOSIS — R051 Acute cough: Secondary | ICD-10-CM

## 2023-05-24 DIAGNOSIS — Z1152 Encounter for screening for COVID-19: Secondary | ICD-10-CM | POA: Diagnosis not present

## 2023-05-24 DIAGNOSIS — J189 Pneumonia, unspecified organism: Secondary | ICD-10-CM | POA: Insufficient documentation

## 2023-05-24 DIAGNOSIS — R509 Fever, unspecified: Secondary | ICD-10-CM | POA: Diagnosis not present

## 2023-05-24 LAB — RESP PANEL BY RT-PCR (FLU A&B, COVID) ARPGX2
Influenza A by PCR: NEGATIVE
Influenza B by PCR: NEGATIVE
SARS Coronavirus 2 by RT PCR: NEGATIVE

## 2023-05-24 MED ORDER — AZITHROMYCIN 250 MG PO TABS: 250.0000 mg | ORAL_TABLET | Freq: Every day | ORAL | 0 refills | Status: DC

## 2023-05-24 MED ORDER — BENZONATATE 200 MG PO CAPS: 200.0000 mg | ORAL_CAPSULE | Freq: Three times a day (TID) | ORAL | 0 refills | Status: DC | PRN

## 2023-05-24 MED ORDER — CEFDINIR 300 MG PO CAPS: 300.0000 mg | ORAL_CAPSULE | Freq: Two times a day (BID) | ORAL | 0 refills | Status: AC

## 2023-05-24 MED ORDER — ACETAMINOPHEN 325 MG PO TABS
975.0000 mg | ORAL_TABLET | Freq: Once | ORAL | Status: AC
  Administered 2023-05-24: 975 mg via ORAL

## 2023-05-24 MED ORDER — ALBUTEROL SULFATE HFA 108 (90 BASE) MCG/ACT IN AERS: 1.0000 | INHALATION_SPRAY | Freq: Four times a day (QID) | RESPIRATORY_TRACT | 0 refills | Status: DC | PRN

## 2023-05-24 MED ORDER — ALBUTEROL SULFATE (2.5 MG/3ML) 0.083% IN NEBU: 2.5000 mg | INHALATION_SOLUTION | Freq: Four times a day (QID) | RESPIRATORY_TRACT | 0 refills | Status: DC | PRN

## 2023-05-24 NOTE — ED Triage Notes (Signed)
Pt c/o body aches, cough, headache, diarrhea, Temp of 101.2 x2days  Pt was around her son who had Pneumonia   Pt is concerned about having pneumonia as well.  Pt has Tylenol cold and flu last night before bed at 6pm.

## 2023-05-24 NOTE — Discharge Instructions (Signed)
Start Zithromax and cefdinir as prescribed.  Albuterol inhaler as needed for shortness of breath.  I have also refilled your albuterol for your nebulizer machine.  Tessalon as needed for cough.  Lots of rest and fluids.  Continue Tylenol or ibuprofen as needed for fever management.  Please follow-up with your PCP in 4 to 6 weeks for repeat chest x-ray to confirm resolution of the infection.  Please go to the ER if you develop any worsening symptoms either while on treatment or once it is completed.  I hope you feel better soon!

## 2023-05-24 NOTE — ED Provider Notes (Signed)
[facility]    CSN: [id number] Arrival date & time: 05/24/23  0818      History   Chief Complaint No chief complaint on file.   HPI Lynn Chavez is a 47 y.o. female  presents for evaluation of URI symptoms for 2 days. Patient reports associated symptoms of cough, congestion, fevers, body aches, ear pain, nausea and diarrhea, headache.  Denies vomiting, sore throat, shortness of breath.  patient does not have a hx of asthma. Patient does actively smoke.  Reports her son recently had pneumonia and she is concerned she may have gotten this as well.  Pt has taken Tylenol OTC for symptoms last dose yesterday. Pt has no other concerns at this time.   HPI  Past Medical History:  Diagnosis Date   Asthma    Brain tumor (HCC)    Cervicalgia    Genital herpes     There are no problems to display for this patient.   Past Surgical History:  Procedure Laterality Date   ABDOMINAL HYSTERECTOMY     CRANIOTOMY  04/2013    OB History     Gravida  1   Para      Term      Preterm      AB      Living  1      SAB      IAB      Ectopic      Multiple      Live Births  1            Home Medications    Prior to Admission medications   Medication Sig Start Date End Date Taking? Authorizing Provider  albuterol (PROVENTIL) (2.5 MG/3ML) 0.083% nebulizer solution Take 3 mLs (2.5 mg total) by nebulization every 6 (six) hours as needed for wheezing or shortness of breath. 05/24/23  Yes Radford Pax, NP  albuterol (VENTOLIN HFA) 108 (90 Base) MCG/ACT inhaler Inhale 1-2 puffs into the lungs every 6 (six) hours as needed for wheezing or shortness of breath. 05/24/23  Yes Radford Pax, NP  azithromycin (ZITHROMAX) 250 MG tablet Take 1 tablet (250 mg total) by mouth daily. Take first 2 tablets together, then 1 every day until finished. 05/24/23  Yes Radford Pax, NP  benzonatate (TESSALON) 200 MG capsule Take 1 capsule (200 mg total) by mouth 3 (three)  times daily as needed. 05/24/23  Yes Radford Pax, NP  betamethasone dipropionate (DIPROLENE) 0.05 % ointment Apply topically 2 (two) times daily. 04/01/23  Yes Radford Pax, NP  cefdinir (OMNICEF) 300 MG capsule Take 1 capsule (300 mg total) by mouth 2 (two) times daily for 10 days. 05/24/23 06/03/23 Yes Radford Pax, NP  cetirizine (ZYRTEC) 10 MG tablet Take 10 mg by mouth daily.    [provider]  erythromycin ophthalmic ointment Place a 1/2 inch ribbon of ointment into the left upper eyelid 3 times a day for 7 days 04/01/23   Radford Pax, NP  ipratropium (ATROVENT) 0.06 % nasal spray Place 2 sprays into both nostrils 4 (four) times daily. 08/31/22   Becky Augusta, NP  oseltamivir (TAMIFLU) 75 MG capsule Take 1 capsule (75 mg total) by mouth every 12 (twelve) hours. 08/31/22   Becky Augusta, NP  predniSONE (DELTASONE) 20 MG tablet Take 2 tablets (40 mg total) by mouth daily. 12/14/22   Valinda Hoar, NP    Family History Family History  Problem Relation Age of Onset  Diabetes Father    Diabetes Paternal Grandmother    Hypotension Mother    Heart disease Mother     Social History Social History   Tobacco Use   Smoking status: Some Days    Current packs/day: 0.25    Average packs/day: 0.3 packs/day for 5.0 years (1.3 ttl pk-yrs)    Types: Cigarettes   Smokeless tobacco: Never   Tobacco comments:    Quit smoking 4 months ago.   Vaping Use   Vaping status: Never Used  Substance Use Topics   Alcohol use: Yes    Comment: rare   Drug use: No     Allergies   Adhesive  [tape], Morphine, and Penicillins   Review of Systems Review of Systems  Constitutional:  Positive for fever.  HENT:  Positive for congestion and ear pain.   Respiratory:  Positive for cough.   Musculoskeletal:  Positive for myalgias.  Neurological:  Positive for headaches.     Physical Exam Triage Vital Signs ED Triage Vitals  Encounter Vitals Group     BP 05/24/23 0844 99/63      Systolic BP Percentile --      Diastolic BP Percentile --      Pulse Rate 05/24/23 0844 (!) 108     Resp --      Temp 05/24/23 0844 (!) 102.3 F (39.1 C)     Temp Source 05/24/23 0844 Oral     SpO2 05/24/23 0844 98 %     Weight 05/24/23 0843 176 lb (79.8 kg)     Height 05/24/23 0843 5\' 3"  (1.6 m)     Head Circumference --      Peak Flow --      Pain Score 05/24/23 0842 5     Pain Loc --      Pain Education --      Exclude from Growth Chart --    No data found.  Updated Vital Signs BP 99/63 (BP Location: Left Arm)   Pulse (!) 108   Temp 99.9 F (37.7 C) (Oral)   Ht 5\' 3"  (1.6 m)   Wt 176 lb (79.8 kg)   LMP 11/04/2015 (Exact Date)   SpO2 98%   BMI 31.18 kg/m   Visual Acuity Right Eye Distance:   Left Eye Distance:   Bilateral Distance:    Right Eye Near:   Left Eye Near:    Bilateral Near:     Physical Exam Vitals and nursing note reviewed.  Constitutional:      General: She is not in acute distress.    Appearance: She is well-developed. She is not ill-appearing.  HENT:     Head: Normocephalic and atraumatic.     Right Ear: Tympanic membrane and ear canal normal.     Left Ear: Tympanic membrane and ear canal normal.     Nose: Congestion present.     Mouth/Throat:     Mouth: Mucous membranes are moist.     Pharynx: Oropharynx is clear. Uvula midline. No oropharyngeal exudate or posterior oropharyngeal erythema.     Tonsils: No tonsillar exudate or tonsillar abscesses.  Eyes:     Conjunctiva/sclera: Conjunctivae normal.     Pupils: Pupils are equal, round, and reactive to light.  Cardiovascular:     Rate and Rhythm: Regular rhythm. Tachycardia present.     Heart sounds: Normal heart sounds.     Comments: Tachycardia at 108 in setting of fever 102 degrees. Pulmonary:     Effort: Pulmonary effort  is normal.     Breath sounds: Normal breath sounds.  Musculoskeletal:     Cervical back: Normal range of motion and neck supple.  Lymphadenopathy:     Cervical:  No cervical adenopathy.  Skin:    General: Skin is warm and dry.  Neurological:     General: No focal deficit present.     Mental Status: She is alert and oriented to person, place, and time.  Psychiatric:        Mood and Affect: Mood normal.        Behavior: Behavior normal.      UC Treatments / Results  Labs (all labs ordered are listed, but only abnormal results are displayed) Labs Reviewed  RESP PANEL BY RT-PCR (FLU A&B, COVID) [id number]    EKG   Radiology DG Chest 2 View  Result Date: 05/24/2023 CLINICAL DATA:  cough, fever, exposure to pneumonia EXAM: CHEST - 2 VIEW COMPARISON:  09/18/2021. FINDINGS: Cardiac silhouette is unremarkable. No pneumothorax or pleural effusion. Alveolar opacity overlying the cardiac silhouette consistent with left lower lobe pneumonia. Follow up recommended to confirm resolution as a precaution. Lungs are otherwise clear. The visualized skeletal structures are unremarkable. IMPRESSION: Left base consolidation consistent with pneumonia. Recommended follow up after therapy to ensure resolution. Electronically Signed   By: Layla Maw M.D.   On: 05/24/2023 09:29    Procedures Procedures (including critical care time)  Medications Ordered in UC Medications  acetaminophen (TYLENOL) tablet 975 mg (975 mg Oral Given 05/24/23 0854)    Initial Impression / Assessment and Plan / UC Course  I have reviewed the triage vital signs and the nursing notes.  Pertinent labs & imaging results that were available during my care of the patient were reviewed by me and considered in my medical decision making (see chart for details).     Patient given Tylenol in clinic for fever.  Reviewed exam and symptoms with patient.  Negative flu and COVID PCR.  X-ray positive for left lower lobe pneumonia.  Start Zithromax and cefdinir.  Tessalon as needed for cough.  Albuterol inhaler as needed patient requested refill of nebulizer albuterol solution.  Advised to  follow-up in 4 to 6 weeks with PCP for repeat x-ray to confirm resolution of infection.  Strict ER precautions reviewed and patient verbalized understanding. Final Clinical Impressions(s) / UC Diagnoses   Final diagnoses:  Fever, unspecified  Acute cough  Pneumonia of left lower lobe due to infectious organism     Discharge Instructions      Start Zithromax and cefdinir as prescribed.  Albuterol inhaler as needed for shortness of breath.  I have also refilled your albuterol for your nebulizer machine.  Tessalon as needed for cough.  Lots of rest and fluids.  Continue Tylenol or ibuprofen as needed for fever management.  Please follow-up with your PCP in 4 to 6 weeks for repeat chest x-ray to confirm resolution of the infection.  Please go to the ER if you develop any worsening symptoms either while on treatment or once it is completed.  I hope you feel better soon!     ED Prescriptions     Medication Sig Dispense Auth. Provider   azithromycin (ZITHROMAX) 250 MG tablet Take 1 tablet (250 mg total) by mouth daily. Take first 2 tablets together, then 1 every day until finished. 6 tablet Radford Pax, NP   cefdinir (OMNICEF) 300 MG capsule Take 1 capsule (300 mg total) by mouth 2 (two) times daily for  10 days. 20 capsule Radford Pax, NP   benzonatate (TESSALON) 200 MG capsule Take 1 capsule (200 mg total) by mouth 3 (three) times daily as needed. 20 capsule Radford Pax, NP   albuterol (VENTOLIN HFA) 108 (90 Base) MCG/ACT inhaler Inhale 1-2 puffs into the lungs every 6 (six) hours as needed for wheezing or shortness of breath. 1 each Radford Pax, NP   albuterol (PROVENTIL) (2.5 MG/3ML) 0.083% nebulizer solution Take 3 mLs (2.5 mg total) by nebulization every 6 (six) hours as needed for wheezing or shortness of breath. 75 mL Radford Pax, NP      PDMP not reviewed this encounter.   Radford Pax, NP 05/24/23 [phone]

## 2023-06-28 ENCOUNTER — Encounter: Payer: Self-pay | Admitting: Certified Nurse Midwife

## 2023-06-28 DIAGNOSIS — Z1231 Encounter for screening mammogram for malignant neoplasm of breast: Secondary | ICD-10-CM

## 2023-06-28 DIAGNOSIS — Z01419 Encounter for gynecological examination (general) (routine) without abnormal findings: Secondary | ICD-10-CM

## 2023-09-15 ENCOUNTER — Ambulatory Visit
Admission: EM | Admit: 2023-09-15 | Discharge: 2023-09-15 | Disposition: A | Discharge status: Home / Self Care | Payer: PRIVATE HEALTH INSURANCE | Attending: Emergency Medicine | Admitting: Emergency Medicine

## 2023-09-15 ENCOUNTER — Ambulatory Visit (INDEPENDENT_AMBULATORY_CARE_PROVIDER_SITE_OTHER): Payer: PRIVATE HEALTH INSURANCE

## 2023-09-15 DIAGNOSIS — S91115A Laceration without foreign body of left lesser toe(s) without damage to nail, initial encounter: Secondary | ICD-10-CM | POA: Diagnosis not present

## 2023-09-15 DIAGNOSIS — S92532A Displaced fracture of distal phalanx of left lesser toe(s), initial encounter for closed fracture: Secondary | ICD-10-CM | POA: Diagnosis not present

## 2023-09-15 DIAGNOSIS — S97102A Crushing injury of unspecified left toe(s), initial encounter: Secondary | ICD-10-CM

## 2023-09-15 MED ORDER — DOXYCYCLINE HYCLATE 100 MG PO CAPS: 100.0000 mg | ORAL_CAPSULE | Freq: Two times a day (BID) | ORAL | 0 refills | Status: AC

## 2023-09-15 MED ORDER — TETANUS-DIPHTH-ACELL PERTUSSIS 5-2.5-18.5 LF-MCG/0.5 IM SUSY
0.5000 mL | PREFILLED_SYRINGE | Freq: Once | INTRAMUSCULAR | Status: AC
  Administered 2023-09-15: 0.5 mL via INTRAMUSCULAR

## 2023-09-15 NOTE — Discharge Instructions (Addendum)
 You broke the tip of your left third toe.  This will take time to heal.  Keep the laceration on your left toe clean and dry.  Wash with warm water and soap twice daily and apply bacitracin and a nonstick dressing until a scab has formed.  Once the scab is formed you can stop applying the ointment and leave the wound open to air when you are at home.  You should cover with a Band-Aid or dry dressing when you are out in public.  Wear the postop shoe to help prevent any further injury and help promote healing of the fracture of your toe.  Take the doxycycline  twice daily with food for 7 days to prevent infection from the laceration on your toe.  If you develop any increased redness, swelling, pus drainage, red streaks going up your toe, or fevers you need to return for reevaluation or seek care in the ER.

## 2023-09-15 NOTE — ED Provider Notes (Signed)
 [facility]    CSN: [id number] Arrival date & time: 09/15/23  1001      History   Chief Complaint Chief Complaint  Patient presents with   Toe Injury    HPI Lynn Chavez is a 48 y.o. female.   HPI  48 year old female past medical history significant for genital herpes, cervicalgia, brain tumor, and asthma presents for evaluation of an injury to her left third toe.  She reports that she was doing her hair yesterday when the hair dryer fell and struck her toe.  She did sustain a laceration to the distal aspect of her toe just before her nail.  There is no active bleeding.  The patient's distal toe is purple and swollen.  Patient has full range of motion of her toe.  She is unsure when her last tetanus shot was.  Past Medical History:  Diagnosis Date   Asthma    Brain tumor (HCC)    Cervicalgia    Genital herpes     There are no active problems to display for this patient.   Past Surgical History:  Procedure Laterality Date   ABDOMINAL HYSTERECTOMY     CRANIOTOMY  04/2013    OB History     Gravida  1   Para      Term      Preterm      AB      Living  1      SAB      IAB      Ectopic      Multiple      Live Births  1            Home Medications    Prior to Admission medications   Medication Sig Start Date End Date Taking? Authorizing Provider  doxycycline  (VIBRAMYCIN ) 100 MG capsule Take 1 capsule (100 mg total) by mouth 2 (two) times daily for 7 days. 09/15/23 09/22/23 Yes Bernardino Ditch, NP  albuterol  (PROVENTIL ) (2.5 MG/3ML) 0.083% nebulizer solution Take 3 mLs (2.5 mg total) by nebulization every 6 (six) hours as needed for wheezing or shortness of breath. 05/24/23   Mayer, Jodi R, NP  albuterol  (VENTOLIN  HFA) [phone] Base) MCG/ACT inhaler Inhale 1-2 puffs into the lungs every 6 (six) hours as needed for wheezing or shortness of breath. 05/24/23   Mayer, Jodi R, NP  azithromycin  (ZITHROMAX ) 250 MG tablet Take 1 tablet  (250 mg total) by mouth daily. Take first 2 tablets together, then 1 every day until finished. 05/24/23   Mayer, Jodi R, NP  benzonatate  (TESSALON ) 200 MG capsule Take 1 capsule (200 mg total) by mouth 3 (three) times daily as needed. 05/24/23   Mayer, Jodi R, NP  betamethasone  dipropionate (DIPROLENE ) 0.05 % ointment Apply topically 2 (two) times daily. 04/01/23   Mayer, Jodi R, NP  cetirizine (ZYRTEC) 10 MG tablet Take 10 mg by mouth daily.    [provider]  erythromycin  ophthalmic ointment Place a 1/2 inch ribbon of ointment into the left upper eyelid 3 times a day for 7 days 04/01/23   Mayer, Jodi R, NP  ipratropium (ATROVENT ) 0.06 % nasal spray Place 2 sprays into both nostrils 4 (four) times daily. 08/31/22   Bernardino Ditch, NP  oseltamivir  (TAMIFLU ) 75 MG capsule Take 1 capsule (75 mg total) by mouth every 12 (twelve) hours. 08/31/22   Bernardino Ditch, NP  predniSONE  (DELTASONE ) 20 MG tablet Take 2 tablets (40 mg total) by mouth daily. 12/14/22  Teresa Shelba SAUNDERS, NP    Family History Family History  Problem Relation Age of Onset   Diabetes Father    Diabetes Paternal Grandmother    Hypotension Mother    Heart disease Mother     Social History Social History   Tobacco Use   Smoking status: Some Days    Current packs/day: 0.25    Average packs/day: 0.3 packs/day for 5.0 years (1.3 ttl pk-yrs)    Types: Cigarettes   Smokeless tobacco: Never   Tobacco comments:    Quit smoking 4 months ago.   Vaping Use   Vaping status: Never Used  Substance Use Topics   Alcohol use: Yes    Comment: rare   Drug use: No     Allergies   Adhesive  [tape], Morphine, and Penicillins   Review of Systems Review of Systems  Musculoskeletal:  Positive for arthralgias and joint swelling.  Skin:  Positive for color change and wound.     Physical Exam Triage Vital Signs ED Triage Vitals  Encounter Vitals Group     BP      Systolic BP Percentile      Diastolic BP Percentile       Pulse      Resp      Temp      Temp src      SpO2      Weight      Height      Head Circumference      Peak Flow      Pain Score      Pain Loc      Pain Education      Exclude from Growth Chart    No data found.  Updated Vital Signs BP 129/82 (BP Location: Left Arm)   Pulse (!) 113   Temp 98.1 F (36.7 C) (Oral)   Resp 17   LMP 11/04/2015 (Exact Date)   SpO2 100%   Visual Acuity Right Eye Distance:   Left Eye Distance:   Bilateral Distance:    Right Eye Near:   Left Eye Near:    Bilateral Near:     Physical Exam Vitals and nursing note reviewed.  Constitutional:      Appearance: Normal appearance.  Musculoskeletal:        General: Swelling, tenderness and signs of injury present. No deformity. Normal range of motion.  Skin:    General: Skin is warm and dry.     Capillary Refill: Capillary refill takes less than 2 seconds.     Findings: Bruising present.  Neurological:     General: No focal deficit present.     Mental Status: She is alert and oriented to person, place, and time.      UC Treatments / Results  Labs (all labs ordered are listed, but only abnormal results are displayed) Labs Reviewed - No data to display  EKG   Radiology DG Toe 3rd Left Result Date: 09/15/2023 CLINICAL DATA:  Crush injury to left third toe yesterday. EXAM: LEFT THIRD TOE COMPARISON:  None Available. FINDINGS: Acute comminuted fracture of the third distal phalanx tuft with up to 2 mm distraction. No additional fracture. No dislocation. Soft tissue swelling of the third toe. IMPRESSION: 1. Acute comminuted fracture of the third distal phalanx tuft. Electronically Signed   By: Elsie ONEIDA Shoulder M.D.   On: 09/15/2023 12:19    Procedures Procedures (including critical care time)  Medications Ordered in UC Medications  Tdap (BOOSTRIX) injection 0.5 mL (  0.5 mLs Intramuscular Given 09/15/23 1150)    Initial Impression / Assessment and Plan / UC Course  I have reviewed the triage  vital signs and the nursing notes.  Pertinent labs & imaging results that were available during my care of the patient were reviewed by me and considered in my medical decision making (see chart for details).   Patient is a nontoxic-appearing 88 old female presenting for evaluation of crush injury to the left third toe as outlined in HPI above.  As you can see in image above, there is a crescent shaped laceration of the distal aspect of the phalanx just proximal of the proximal cuticle of the toenail without active bleeding.  The distal phalanx is ecchymotic and edematous but patient has full range of motion.  There is a small subungual hematoma underneath the nailbed as well.  The patient is unsure when her last tetanus shot was and we have no records available in epic so I will have staff update her tetanus shot here in clinic.  I have advised her that I cannot repair the laceration given that this injury happened 24 hours ago.  I will obtain a radiograph to rule out any bony abnormality and discharge patient home with prophylactic antibiotics.  Left third toe x-rays independently reviewed and evaluated by me.  Pression: There is a comminuted fracture of the distal phalanx of the left third toe with minimal displacement.  Radiology read is pending.  I will discharge patient home with a diagnosis of distal phalanx fracture of her left third toe with a postop shoe to help provide support.  I will have staff clean her wound and apply a nonstick dressing and bacitracin ointment.  She should continue to clean her wound with soap and water twice daily and apply bacitracin ointment and a nonstick dressing until a scab has formed.  Once the scab is formed she can stop applying bacitracin ointment and leave open to air when at home and cover it with a dry dressing when she is out in public.  I will also start her on doxycycline  100 mg twice daily for 7 days to prevent infection.  Return precautions  reviewed.  Final Clinical Impressions(s) / UC Diagnoses   Final diagnoses:  Crushing injury of toe of left foot, initial encounter  Closed displaced fracture of distal phalanx of lesser toe of left foot, initial encounter  Laceration of lesser toe of left foot without foreign body present or damage to nail, initial encounter     Discharge Instructions      You broke the tip of your left third toe.  This will take time to heal.  Keep the laceration on your left toe clean and dry.  Wash with warm water and soap twice daily and apply bacitracin and a nonstick dressing until a scab has formed.  Once the scab is formed you can stop applying the ointment and leave the wound open to air when you are at home.  You should cover with a Band-Aid or dry dressing when you are out in public.  Wear the postop shoe to help prevent any further injury and help promote healing of the fracture of your toe.  Take the doxycycline  twice daily with food for 7 days to prevent infection from the laceration on your toe.  If you develop any increased redness, swelling, pus drainage, red streaks going up your toe, or fevers you need to return for reevaluation or seek care in the ER.  ED Prescriptions     Medication Sig Dispense Auth. Provider   doxycycline  (VIBRAMYCIN ) 100 MG capsule Take 1 capsule (100 mg total) by mouth 2 (two) times daily for 7 days. 14 capsule Bernardino Ditch, NP      PDMP not reviewed this encounter.   Bernardino Ditch, NP 09/15/23 1226

## 2023-09-15 NOTE — ED Triage Notes (Signed)
 Patient dropped a dryer on her left foot. 3rd didget.

## 2024-06-05 ENCOUNTER — Ambulatory Visit
Admission: EM | Admit: 2024-06-05 | Discharge: 2024-06-05 | Disposition: A | Discharge status: Home / Self Care | Payer: PRIVATE HEALTH INSURANCE | Attending: Emergency Medicine | Admitting: Emergency Medicine

## 2024-06-05 ENCOUNTER — Encounter: Payer: Self-pay | Admitting: Emergency Medicine

## 2024-06-05 DIAGNOSIS — J4521 Mild intermittent asthma with (acute) exacerbation: Secondary | ICD-10-CM | POA: Diagnosis not present

## 2024-06-05 DIAGNOSIS — J069 Acute upper respiratory infection, unspecified: Secondary | ICD-10-CM

## 2024-06-05 DIAGNOSIS — J189 Pneumonia, unspecified organism: Secondary | ICD-10-CM

## 2024-06-05 MED ORDER — ALBUTEROL SULFATE (2.5 MG/3ML) 0.083% IN NEBU: 2.5000 mg | INHALATION_SOLUTION | Freq: Four times a day (QID) | RESPIRATORY_TRACT | 0 refills | Status: AC | PRN

## 2024-06-05 MED ORDER — PREDNISONE 20 MG PO TABS: 40.0000 mg | ORAL_TABLET | Freq: Every day | ORAL | 0 refills | Status: AC

## 2024-06-05 MED ORDER — BENZONATATE 200 MG PO CAPS: 200.0000 mg | ORAL_CAPSULE | Freq: Three times a day (TID) | ORAL | 0 refills | Status: AC | PRN

## 2024-06-05 MED ORDER — ALBUTEROL SULFATE HFA 108 (90 BASE) MCG/ACT IN AERS: 1.0000 | INHALATION_SPRAY | Freq: Four times a day (QID) | RESPIRATORY_TRACT | 0 refills | Status: AC | PRN

## 2024-06-05 MED ORDER — IPRATROPIUM BROMIDE 0.06 % NA SOLN: 2.0000 | Freq: Four times a day (QID) | NASAL | 12 refills | Status: AC

## 2024-06-05 NOTE — Discharge Instructions (Signed)
 Use the albuterol  nebulizer machine, or your inhaler, every 4-6 hours as needed for any shortness of breath or wheezing.  Use over-the-counter Tylenol  and or ibuprofen  according to the package instructions as needed for any fever or pain.  Take the prednisone  40 mg once daily for 5 days to help decrease pulmonary inflammation and improve your breathing.  Use the Atrovent  nasal spray, 2 squirts in each nostril every 6 hours, as needed for runny nose and postnasal drip.  Use the Tessalon  Perles every 8 hours during the day.  Take them with a small sip of water.  They may give you some numbness to the base of your tongue or a metallic taste in your mouth, this is normal.  Use the Promethazine  DM cough syrup at bedtime for cough and congestion.  It will make you drowsy so do not take it during the day.  Return for reevaluation or see your primary care provider for any new or worsening symptoms.

## 2024-06-05 NOTE — ED Provider Notes (Addendum)
 [facility]    CSN: [id number] Arrival date & time: 06/05/24  0845      History   Chief Complaint Chief Complaint  Patient presents with   Cough    HPI Lynn Chavez is a 48 y.o. female.   HPI  48 year old female with past medical history significant for asthma, cervicalgia, brain tumor presents for evaluation of respiratory symptoms that began yesterday.  She reports runny nose, nasal congestion, nonproductive cough, shortness with, wheezing.  No fever.  Her children and her nephew have all had similar symptoms.  They all tested negative for COVID.  She is declining COVID and flu testing today.  Past Medical History:  Diagnosis Date   Asthma    Brain tumor (HCC)    Cervicalgia    Genital herpes     There are no active problems to display for this patient.   Past Surgical History:  Procedure Laterality Date   ABDOMINAL HYSTERECTOMY     CRANIOTOMY  04/2013    OB History     Gravida  1   Para      Term      Preterm      AB      Living  1      SAB      IAB      Ectopic      Multiple      Live Births  1            Home Medications    Prior to Admission medications   Medication Sig Start Date End Date Taking? Authorizing Provider  albuterol  (PROVENTIL ) (2.5 MG/3ML) 0.083% nebulizer solution Take 3 mLs (2.5 mg total) by nebulization every 6 (six) hours as needed for wheezing or shortness of breath. 06/05/24   Bernardino Ditch, NP  albuterol  (VENTOLIN  HFA) 108 (90 Base) MCG/ACT inhaler Inhale 1-2 puffs into the lungs every 6 (six) hours as needed for wheezing or shortness of breath. 06/05/24   Bernardino Ditch, NP  benzonatate  (TESSALON ) 200 MG capsule Take 1 capsule (200 mg total) by mouth 3 (three) times daily as needed. 06/05/24   Bernardino Ditch, NP  cetirizine (ZYRTEC) 10 MG tablet Take 10 mg by mouth daily.    [provider]  ipratropium (ATROVENT ) 0.06 % nasal spray Place 2 sprays into both nostrils 4 (four) times  daily. 06/05/24   Bernardino Ditch, NP  predniSONE  (DELTASONE ) 20 MG tablet Take 2 tablets (40 mg total) by mouth daily. 06/05/24   Bernardino Ditch, NP    Family History Family History  Problem Relation Age of Onset   Diabetes Father    Diabetes Paternal Grandmother    Hypotension Mother    Heart disease Mother     Social History Social History   Tobacco Use   Smoking status: Some Days    Current packs/day: 0.25    Average packs/day: 0.3 packs/day for 5.0 years (1.3 ttl pk-yrs)    Types: Cigarettes   Smokeless tobacco: Never   Tobacco comments:    Quit smoking 4 months ago.   Vaping Use   Vaping status: Never Used  Substance Use Topics   Alcohol use: Yes    Comment: rare   Drug use: No     Allergies   Adhesive  [tape], Morphine, and Penicillins   Review of Systems Review of Systems  Constitutional:  Negative for fever.  HENT:  Positive for congestion and rhinorrhea.   Respiratory:  Positive for cough, shortness of breath  and wheezing.      Physical Exam Triage Vital Signs ED Triage Vitals  Encounter Vitals Group     BP      Girls Systolic BP Percentile      Girls Diastolic BP Percentile      Boys Systolic BP Percentile      Boys Diastolic BP Percentile      Pulse      Resp      Temp      Temp src      SpO2      Weight      Height      Head Circumference      Peak Flow      Pain Score      Pain Loc      Pain Education      Exclude from Growth Chart    No data found.  Updated Vital Signs BP 135/63 (BP Location: Right Arm)   Pulse 78   Temp 98.7 F (37.1 C) (Oral)   Resp 15   Ht 5' 3 (1.6 m)   Wt 175 lb 14.8 oz (79.8 kg)   LMP 11/04/2015 (Exact Date)   SpO2 95%   BMI 31.16 kg/m   Visual Acuity Right Eye Distance:   Left Eye Distance:   Bilateral Distance:    Right Eye Near:   Left Eye Near:    Bilateral Near:     Physical Exam Vitals and nursing note reviewed.  Constitutional:      Appearance: Normal appearance. She is not  ill-appearing.  HENT:     Head: Normocephalic and atraumatic.  Cardiovascular:     Rate and Rhythm: Normal rate and regular rhythm.     Pulses: Normal pulses.     Heart sounds: Normal heart sounds. No murmur heard.    No friction rub. No gallop.  Pulmonary:     Effort: Pulmonary effort is normal.     Breath sounds: Wheezing present. No rhonchi or rales.  Skin:    General: Skin is warm and dry.     Capillary Refill: Capillary refill takes less than 2 seconds.     Findings: No rash.  Neurological:     General: No focal deficit present.     Mental Status: She is alert and oriented to person, place, and time.      UC Treatments / Results  Labs (all labs ordered are listed, but only abnormal results are displayed) Labs Reviewed  RESP PANEL BY RT-PCR (FLU A&B, COVID) [id number]    EKG   Radiology No results found.  Procedures Procedures (including critical care time)  Medications Ordered in UC Medications - No data to display  Initial Impression / Assessment and Plan / UC Course  I have reviewed the triage vital signs and the nursing notes.  Pertinent labs & imaging results that were available during my care of the patient were reviewed by me and considered in my medical decision making (see chart for details).   Patient is a nontoxic-appearing 48 year old female returning for evaluation of acute onset respiratory symptoms and outlined HPI above.  In the exam room she is able to speak in full sentence without dyspnea or tachypnea.  She is declining COVID or flu testing at this time as her children and nephew have had respiratory symptoms and they all tested negative for COVID.  She needs a refill of her albuterol  nebulizer as well as her albuterol  inhaler.  She is also requesting a new circuit  as nipple for her reservoir broke this morning.  I will discharge her home with a diagnosis of viral URI with cough and mild intermittent asthma exacerbation.  I have refilled both her  albuterol  nebulizer solution and albuterol  inhaler which she can use every 4-6 hours as needed for shortness of breath or wheezing.  Additionally, I have prescribed 40 mg prednisone  daily for 5 days to help decrease pulmonary inflammation.  I have also refilled her Atrovent  nasal spray, Tessalon  Perles, and Promethazine  DM cough syrup.  Return precautions reviewed.  Work note provided.   Final Clinical Impressions(s) / UC Diagnoses   Final diagnoses:  Viral URI with cough  Mild intermittent asthma with exacerbation     Discharge Instructions      Use the albuterol  nebulizer machine, or your inhaler, every 4-6 hours as needed for any shortness of breath or wheezing.  Use over-the-counter Tylenol  and or ibuprofen  according to the package instructions as needed for any fever or pain.  Take the prednisone  40 mg once daily for 5 days to help decrease pulmonary inflammation and improve your breathing.  Use the Atrovent  nasal spray, 2 squirts in each nostril every 6 hours, as needed for runny nose and postnasal drip.  Use the Tessalon  Perles every 8 hours during the day.  Take them with a small sip of water.  They may give you some numbness to the base of your tongue or a metallic taste in your mouth, this is normal.  Use the Promethazine  DM cough syrup at bedtime for cough and congestion.  It will make you drowsy so do not take it during the day.  Return for reevaluation or see your primary care provider for any new or worsening symptoms.      ED Prescriptions     Medication Sig Dispense Auth. Provider   benzonatate  (TESSALON ) 200 MG capsule Take 1 capsule (200 mg total) by mouth 3 (three) times daily as needed. 20 capsule Bernardino Ditch, NP   ipratropium (ATROVENT ) 0.06 % nasal spray Place 2 sprays into both nostrils 4 (four) times daily. 15 mL Bernardino Ditch, NP   predniSONE  (DELTASONE ) 20 MG tablet Take 2 tablets (40 mg total) by mouth daily. 10 tablet Bernardino Ditch, NP   albuterol   (PROVENTIL ) (2.5 MG/3ML) 0.083% nebulizer solution Take 3 mLs (2.5 mg total) by nebulization every 6 (six) hours as needed for wheezing or shortness of breath. 75 mL Bernardino Ditch, NP   albuterol  (VENTOLIN  HFA) 108 (90 Base) MCG/ACT inhaler Inhale 1-2 puffs into the lungs every 6 (six) hours as needed for wheezing or shortness of breath. 1 each Bernardino Ditch, NP      PDMP not reviewed this encounter.   Bernardino Ditch, NP 06/05/24 [id number]    Bernardino Ditch, NP 06/05/24 [phone]

## 2024-06-05 NOTE — ED Triage Notes (Signed)
Patient c/o cough and chest congestion that started yesterday.  Patient denies fevers.
